# Patient Record
Sex: Male | Born: 1990 | Race: Black or African American | Hispanic: No | Marital: Single | Smoking: Never smoker
Health system: Southern US, Community
[De-identification: ages and names within clinical notes are randomized; demographics above are authoritative.]

---
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
## 2010-01-15 ENCOUNTER — Ambulatory Visit: Payer: Self-pay | Admitting: Internal Medicine

## 2010-01-15 DIAGNOSIS — IMO0002 Reserved for concepts with insufficient information to code with codable children: Secondary | ICD-10-CM

## 2010-03-07 NOTE — Assessment & Plan Note (Signed)
Summary: MENACTRA//Tdap//UA//alp  Nurse Visit   Allergies: No Known Drug Allergies Laboratory Results   Urine Tests  Date/Time Received: September 01, 2009 12:22 PM  Date/Time Reported: September 01, 2009 12:22 PM   Routine Urinalysis   Color: yellow Appearance: Clear Glucose: negative   (Normal Range: Negative) Bilirubin: negative   (Normal Range: Negative) Ketone: negative   (Normal Range: Negative) Spec. Gravity: 1.025   (Normal Range: 1.003-1.035) Blood: negative   (Normal Range: Negative) pH: 6.0   (Normal Range: 5.0-8.0) Protein: 1+   (Normal Range: Negative) Urobilinogen: 0.2   (Normal Range: 0-1) Nitrite: negative   (Normal Range: Negative) Leukocyte Esterace: trace   (Normal Range: Negative)    Comments: Wynona Canes, CMA  September 01, 2009 12:22 PM     Immunizations Administered:  Tetanus Vaccine:    Vaccine Type: Tdap    Site: left deltoid    Mfr: GlaxoSmithKline    Dose: 0.5 ml    Route: IM    Given by: Sid Falcon LPN    Exp. Date: 02/23/2011    Lot #: [id number]  Meningococcal Vaccine:    Vaccine Type: Menactra    Site: right deltoid    Mfr: Sanofi Pasteur    Dose: 0.5 ml    Route: IM    Given by: Sid Falcon LPN    Exp. Date: 02/22/2011    Lot #: [id number]  Orders Added: 1)  UA Dipstick w/o Micro (automated)  [81003] 2)  Tdap => 51yrs IM [90715] 3)  Menactra IM [90734] 4)  Admin 1st Vaccine [90471] 5)  Admin of Any Addtl Vaccine [84132]  Laboratory Results   Urine Tests    Routine Urinalysis   Color: yellow Appearance: Clear Glucose: negative   (Normal Range: Negative) Bilirubin: negative   (Normal Range: Negative) Ketone: negative   (Normal Range: Negative) Spec. Gravity: 1.025   (Normal Range: 1.003-1.035) Blood: negative   (Normal Range: Negative) pH: 6.0   (Normal Range: 5.0-8.0) Protein: 1+   (Normal Range: Negative) Urobilinogen: 0.2   (Normal Range: 0-1) Nitrite: negative   (Normal Range: Negative) Leukocyte Esterace:  trace   (Normal Range: Negative)    Comments: Wynona Canes, CMA  September 01, 2009 12:22 PM

## 2010-03-07 NOTE — Progress Notes (Signed)
Summary: please return call re vaccines  Phone Note Call from Patient Call back at [phone]   Caller: Dad Call For: Evelena Peat MD Summary of Call: Dad would like for you to please give him a call about Immunization that was giving to his son.  Follow-up for Phone Call        Pt has had T-dap and Menactra.  Father informed. Follow-up by: Sid Falcon LPN,  December 25, 2009 5:10 PM  Additional Follow-up for Phone Call Additional follow up Details #1::        Per father, pt has not had Chicken pox, questioning in the Varicella is needed?  Pt is scheduled for flu shot this  Wed, pt home from college.  Additional Follow-up by: Sid Falcon LPN,  December 25, 2009 5:10 PM    Additional Follow-up for Phone Call Additional follow up Details #2::    If we are CERTAIN that he has not had chicken pox he should get Varicella if never received. He should have had Varicella vaccine previously.  If there is ? of prior chicken pox infection, we  would have to check Varicella titer. Follow-up by: Evelena Peat MD,  December 25, 2009 5:26 PM  Additional Follow-up for Phone Call Additional follow up Details #3:: Details for Additional Follow-up Action Taken: OV scheduled, father informed Additional Follow-up by: Sid Falcon LPN,  December 26, 2009 11:02 AM

## 2010-03-07 NOTE — Letter (Signed)
Summary: Douglas Joyce at [facility] at [facility]   Imported By: Maryln Gottron 09/14/2009 13:19:54  _____________________________________________________________________  External Attachment:    Type:   Image     Comment:   External Document

## 2010-03-07 NOTE — Assessment & Plan Note (Signed)
Summary: flu shot, discuss Varicella, Hep A/nn   Vital Signs:  Patient profile:   20 year old male Temp:     98.0 degrees F oral  Vitals Entered By: Sid Falcon LPN (December 27, 2009 12:28 PM)  History of Present Illness: Patient here with immunization questions. Accompanied by father. Father is certain he has not had clinical infection with varicella. No prior vaccination for varicella. Also needs flu vaccine. No egg allergy. No history of hepatitis A. Currently no out of country trips planned. Other immunizations are up to date.  Allergies: No Known Drug Allergies  Past History:  Past Medical History: Last updated: 08/28/2009 no chronic problems.  Physical Exam  General:  Well-developed,well-nourished,in no acute distress; alert,appropriate and cooperative throughout examination Lungs:  Normal respiratory effort, chest expands symmetrically. Lungs are clear to auscultation, no crackles or wheezes. Heart:  Normal rate and regular rhythm. S1 and S2 normal without gallop, murmur, click, rub or other extra sounds.   Impression & Recommendations:  Problem # 1:  Preventive Health Care (ICD-V70.0) discuss immunization issues. Flexing given. We discussed option of checking varicella antibody levels but decided to go ahead with vaccinations and they're certain he has not had this clinically.  Hep A given to return in 6 months for booster  Other Orders: Admin 1st Vaccine (16109) Flu Vaccine 71yrs + (60454) Hepatitis A Vaccine (Adult Dose) (09811) Admin of Any Addtl Vaccine (91478) Varicella  [phone])   Orders Added: 1)  Admin 1st Vaccine [90471] 2)  Flu Vaccine 19yrs + [13086] 3)  Hepatitis A Vaccine (Adult Dose) [90632] 4)  Admin of Any Addtl Vaccine [90472] 5)  Varicella  [90716] 6)  Est. Patient Level III [57846]   Immunizations Administered:  Hepatitis A Vaccine # 1:    Vaccine Type: HepA    Site: left deltoid    Mfr: GlaxoSmithKline    Dose: 1.0 ml    Route:  IM    Given by: Sid Falcon LPN    Exp. Date: 01/04/2012    Lot #: [id number]  Varicella Vaccine # 1:    Vaccine Type: Varicella    Site: left deltoid    Mfr: Merck    Dose: 0.5 ml    Route: [place]    Given by: Sid Falcon LPN    Exp. Date: 04/05/2011    Lot #: [id number]   Immunizations Administered:  Hepatitis A Vaccine # 1:    Vaccine Type: HepA    Site: left deltoid    Mfr: GlaxoSmithKline    Dose: 1.0 ml    Route: IM    Given by: Sid Falcon LPN    Exp. Date: 01/04/2012    Lot #: [id number]  Varicella Vaccine # 1:    Vaccine Type: Varicella    Site: left deltoid    Mfr: Merck    Dose: 0.5 ml    Route: [place]    Given by: Sid Falcon LPN    Exp. Date: 04/05/2011    Lot #: [id number] Flu Vaccine Consent Questions     Do you have a history of severe allergic reactions to this vaccine? no    Any prior history of allergic reactions to egg and/or gelatin? no    Do you have a sensitivity to the preservative Thimersol? no    Do you have a past history of Guillan-Barre Syndrome? no    Do you currently have an acute febrile illness? no    Have you ever had a severe reaction to latex?  no    Vaccine information given and explained to patient? yes    Are you currently pregnant? no    Lot Number:[id number]   Exp Date:08/04/2010   Site Given  Left Deltoid IM .lbflu

## 2010-03-07 NOTE — Progress Notes (Signed)
Summary: Pts mom is req that pt have complete cpx and cpx labs and ua  Phone Note Call from Patient Call back at [phone] [facility] cell or Work #  [phone]   Caller: mom- Carolyn Stare Summary of Call: Pts mom wants pt to get a complete physical with cpx labs and ua done when pt comes in today.   Pls call pts mom on her cell [phone] Initial call taken by: Lucy Antigua,  September 01, 2009 8:20 AM  Follow-up for Phone Call        Pts mom called back and said that she wants to have cpx labs done on her son.  She said that her son already had his cpx done. she just is req to have the cpx labs done and also get any immunizations done that he may need for college.  Follow-up by: Lucy Antigua,  September 01, 2009 11:25 AM  Additional Follow-up for Phone Call Additional follow up Details #1::        Pt here fos shots Additional Follow-up by: Sid Falcon LPN,  September 01, 2009 2:28 PM

## 2010-03-07 NOTE — Assessment & Plan Note (Signed)
Summary: new to est//ccm   Vital Signs:  Patient profile:   20 year old male Height:      69 inches Weight:      168 pounds BMI:     24.90 Temp:     97.5 degrees F oral BP sitting:   102 / 70  (left arm) Cuff size:   regular  Vitals Entered By: Kathrynn Speed CMA (August 28, 2009 11:31 AM) CC: New pt to est, right foot itchin between toes, src Is Patient Diabetic? No  Vision Screening:Left eye w/o correction: 20 / 15 Right Eye w/o correction: 20 / 25 Both eyes w/o correction:  20/ 15        Vision Entered By: Kathrynn Speed CMA (August 28, 2009 12:17 PM)   History of Present Illness: New patient to establish care.  Past medical history reviewed. No chronic illness. No Medications. No prior surgeries. No known drug allergies.  Family history significant for parent with hypertension. Grandparent with stroke.  Otherwise unrevealing.  Social history is that he is single. Will start college at [employer] this fall. Plans to major in architecture. Nonsmoker. No regular alcohol use.  No old records for review at this time. We have no record of prior immunizations. He does not call having any vaccines in the past couple of years.  Preventive Screening-Counseling & Management  Caffeine-Diet-Exercise     Does Patient Exercise: yes  Current Medications (verified): 1)  None  Allergies (verified): No Known Drug Allergies  Past History:  Family History: Last updated: 08/28/2009 Both Parents: HTN Grandparents: Arthritis, HTN, Stroke Brother: Healthy  Social History: Last updated: 08/28/2009 Single Regular exercise-yes  Risk Factors: Exercise: yes (08/28/2009)  Past Medical History: no chronic problems.  Past Surgical History: None  Family History: Both Parents: HTN Grandparents: Arthritis, HTN, Stroke Brother: Healthy  Social History: Single Regular exercise-yes Does Patient Exercise:  yes  Review of Systems  The patient denies  anorexia, fever, weight loss, weight gain, vision loss, decreased hearing, hoarseness, chest pain, syncope, dyspnea on exertion, peripheral edema, prolonged cough, headaches, hemoptysis, abdominal pain, melena, hematochezia, severe indigestion/heartburn, hematuria, incontinence, genital sores, muscle weakness, suspicious skin lesions, transient blindness, difficulty walking, depression, unusual weight change, abnormal bleeding, enlarged lymph nodes, and testicular masses.    Physical Exam  General:  Well-developed,well-nourished,in no acute distress; alert,appropriate and cooperative throughout examination Head:  Normocephalic and atraumatic without obvious abnormalities. No apparent alopecia or balding. Eyes:  No corneal or conjunctival inflammation noted. EOMI. Perrla. Funduscopic exam benign, without hemorrhages, exudates or papilledema. Vision grossly normal. Ears:  External ear exam shows no significant lesions or deformities.  Otoscopic examination reveals clear canals, tympanic membranes are intact bilaterally without bulging, retraction, inflammation or discharge. Hearing is grossly normal bilaterally. Mouth:  Oral mucosa and oropharynx without lesions or exudates.  Teeth in good repair. Neck:  No deformities, masses, or tenderness noted. Lungs:  Normal respiratory effort, chest expands symmetrically. Lungs are clear to auscultation, no crackles or wheezes. Heart:  Normal rate and regular rhythm. S1 and S2 normal without gallop, murmur, click, rub or other extra sounds. Abdomen:  Bowel sounds positive,abdomen soft and non-tender without masses, organomegaly or hernias noted. Genitalia:  Testes bilaterally descended without nodularity, tenderness or masses. No scrotal masses or lesions. No penis lesions or urethral discharge. Msk:  No deformity or scoliosis noted of thoracic or lumbar spine.   Extremities:  No clubbing, cyanosis, edema, or deformity noted with normal full range of motion of all  joints.   Neurologic:  alert & oriented X3, cranial nerves II-XII intact, and strength normal in all extremities.   Skin:  no rashes and no suspicious lesions.   Cervical Nodes:  No lymphadenopathy noted Psych:  normally interactive, good eye contact, not anxious appearing, and not depressed appearing.     Impression & Recommendations:  Problem # 1:  Preventive Health Care (ICD-V70.0) obtain old records regarding immunizations. Patient will likely need Menactra and possibly some others

## 2010-03-07 NOTE — Progress Notes (Signed)
Summary: immunizations & anything else needed?  Phone Note Call from Patient   Caller: Patient Call For: Evelena Peat MD Complaint: Urinary/GYN Problems Summary of Call: patient was calling to see if we received his immunization records and if so if he needs to come in for an office visit? Initial call taken by: Kern Reap CMA Duncan Dull),  August 29, 2009 2:36 PM  Follow-up for Phone Call        Pt informed we did receive the records and are reviewing the vacinnations faxed Follow-up by: Sid Falcon LPN,  August 30, 2009 10:19 AM  Additional Follow-up for Phone Call Additional follow up Details #1::        Dad, Leighton Parody, calling again to see if son needs any immunizations & to check about whether he needs to come back for anything else.  Was the physcial complete?  [phone] Additional Follow-up by: Rudy Jew, RN,  August 30, 2009 1:15 PM    Additional Follow-up for Phone Call Additional follow up Details #2::    Records on Dr Lucie Leather desk Sid Falcon LPN  August 30, 2009 2:09 PM nurse visit only for [facility] and Tdap.  Forms have been signed.  Follow-up by: Evelena Peat MD,  August 30, 2009 5:36 PM  Additional Follow-up for Phone Call Additional follow up Details #3:: Details for Additional Follow-up Action Taken: Father informed, questioned if he needed UA.  Yes, lets do UA per Dr Chad Cordial LPN  August 31, 2009 10:13 AM

## 2010-03-07 NOTE — Letter (Signed)
Summary: Health Evaluation Form/[facility] Evaluation Form/[facility]   Imported By: Maryln Gottron 09/05/2009 12:54:39  _____________________________________________________________________  External Attachment:    Type:   Image     Comment:   External Document

## 2010-03-07 NOTE — Miscellaneous (Signed)
Summary: 1993 - 2011  1993 - 2011   Imported By: Maryln Gottron 01/05/2010 15:14:29  _____________________________________________________________________  External Attachment:    Type:   Image     Comment:   External Document

## 2010-03-08 NOTE — Assessment & Plan Note (Signed)
Summary: ?INFECTION UNDER ARM PITS/NJR   Vital Signs:  Patient profile:   20 year old male Weight:      166 pounds Temp:     98.1 degrees F oral BP sitting:   100 / 62  (left arm) Cuff size:   regular  Vitals Entered By: Duard Brady LPN (January 15, 2010 4:33 PM) CC: c/o knot/?boil inder (L) arm  on ABX from college doctor Is Patient Diabetic? No   CC:  c/o knot/?boil inder (L) arm  on ABX from college doctor.  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  [facility] [place] [facility] a furuncle in the left axillary area.  He is on antibiotic therapy at this time with daily improvement in the size and tenderness of the abscess  involving the left axilla.  His father was concerned about a rash involving the upper back area that predated antibiotic use.  Allergies (verified): No Known Drug Allergies  Physical Exam  General:  Well-developed,well-nourished,in no acute distress; alert,appropriate and cooperative throughout examination Skin:  2 slightly tender inflammatory nodules in the left axilla two to 3 cm in diameter he also had patchy areas of dry, flaky skin involving the upper back area, consistent with some mild tinea versicolor   Impression & Recommendations:  Problem # 1:  ABSCESS, AXILLA (ICD-682.3)  Patient Instructions: 1)  Take your antibiotic as prescribed until ALL of it is gone, but stop if you develop a rash or swelling and contact our office as soon as possible. 2)  warm compresses to the left axilla 3 times daily 3)  Please schedule a follow-up appointment as needed.   Orders Added: 1)  Est. Patient Level III [16109]

## 2010-03-12 ENCOUNTER — Ambulatory Visit (INDEPENDENT_AMBULATORY_CARE_PROVIDER_SITE_OTHER): Payer: BC Managed Care – PPO | Admitting: Family Medicine

## 2010-03-12 ENCOUNTER — Encounter: Payer: Self-pay | Admitting: Family Medicine

## 2010-03-12 VITALS — BP 102/70 | Temp 98.0°F | Ht 69.75 in | Wt 175.0 lb

## 2010-03-12 DIAGNOSIS — L02419 Cutaneous abscess of limb, unspecified: Secondary | ICD-10-CM

## 2010-03-12 DIAGNOSIS — IMO0002 Reserved for concepts with insufficient information to code with codable children: Secondary | ICD-10-CM

## 2010-03-12 MED ORDER — DOXYCYCLINE HYCLATE 100 MG PO TABS: 100.0000 mg | ORAL_TABLET | Freq: Two times a day (BID) | ORAL | Status: AC

## 2010-03-12 NOTE — Patient Instructions (Signed)
Use warm compresses several times daily. Finish out antibiotic Follow up promptly for any fever or worsening rash/lesions.

## 2010-03-12 NOTE — Progress Notes (Signed)
  Subjective:    Patient ID: Douglas Joyce, male    DOB: Mar 13, 1990, 20 y.o.   MRN: [id number]  HPI Patient seen with skin lesion left axillary region. Similar type infection couple months ago treated with antibiotics and resolved. First noticed small pimple-like lesion left lower side couple weeks ago which resolved on its own. Denies any fever or chills. No known history of MRSA. No known drug allergies.   Review of Systems    denies any headache, fever, chills, appetite change, cough Objective:   Physical Exam  healthy-appearing 20 year old male Patient has a small erythematous papule in the left axillary region. Nonfluctuant. Some periodic drainage from the surface. Wound culture obtained Smaller somewhat similar indurated lesion just superior to this No other rashes. Chest clear to auscultation Heart regular rate        Assessment & Plan:  Recurrent small abscess left axilla draining spontaneously. Rule out MRSA Wound culture obtained. Start doxycycline 100 mg twice a day for 10 days

## 2010-03-19 ENCOUNTER — Telehealth: Payer: Self-pay | Admitting: Family Medicine

## 2010-03-19 NOTE — Telephone Encounter (Signed)
Needs results of would culture that was done on last week

## 2010-03-19 NOTE — Telephone Encounter (Signed)
Had long talk with pt's dad.  There is no evidence that [facility] ever received wound cx.  We are unable to determine where sample might have been lost.  I will speak with our lab director tomorrow to see if any further thoughts.  In any event Douglas Joyce is doing better with no fever and wounds are drying up well on Doxycycline.

## 2010-03-23 ENCOUNTER — Telehealth: Payer: Self-pay | Admitting: Family Medicine

## 2010-03-23 NOTE — Telephone Encounter (Signed)
Pts dad called for results of recent lab work.... Can be reached at [phone].

## 2010-03-26 NOTE — Telephone Encounter (Signed)
Message left on [phone] referring back to phone call on 2/13 explaining wound culture had been lost, no lab result available

## 2010-03-26 NOTE — Telephone Encounter (Signed)
As noted.  His wound culture has not been able to be located.

## 2010-06-22 NOTE — Op Note (Signed)
Douglas Joyce, Douglas Joyce                        ACCOUNT NO.:  [id number]   MEDICAL RECORD NO.:  [id number]                   PATIENT TYPE:  AMB   LOCATION:  DSC                                  FACILITY:  [facility]   PHYSICIAN:  Nadara Mustard, M.D.                DATE OF BIRTH:  [birth year]   DATE OF PROCEDURE:  10/20/2003  DATE OF DISCHARGE:                                 OPERATIVE REPORT   PREOPERATIVE DIAGNOSES:  1.  Supernumerary digit, right fifth toe.  2.  Skin tag, left little finger.   POSTOPERATIVE DIAGNOSIS:  1.  Supernumerary digit, right fifth toe.  2.  Skin tag, left little finger.   OPERATION PERFORMED:  1.  Excision of supernumerary digit, right fifth toe.  2.  Excision of left little finger skin tag.   SURGEON:  Nadara Mustard, M.D.   ANESTHESIA:  General.   ESTIMATED BLOOD LOSS:  Minimal.   ANTIBIOTICS:  None.   DRAINS:  None.   COMPLICATIONS:  None.   TOURNIQUET TIME:  None.   DISPOSITION:  To post anesthesia care unit in stable condition.   INDICATIONS FOR PROCEDURE:  The patient is a 20 year old gentleman who is  status post an excision of a sixth digit, left little finger and right  little toe, who has had recurrence of a skin tag on the left little finger  and recurrence of bony growth on the right fifth toe.  The patient has had  pain with activities of daily living and presents at this time for excision  of these two masses.  The risks and benefits were discussed with the patient  and his family including infection, neurovascular injury, persistent pain,  recurrence of the deformity, need for additional surgery.  The patient and  his family state they understand and wish to proceed at this time.   DESCRIPTION OF PROCEDURE:  The patient was brought to the operating room 5  and underwent a general anesthetic.  After adequate level of anesthesia  obtained, the patient's right lower extremity and left upper extremity were  prepped using DuraPrep and  draped into a sterile field.  Attention was first  focused on the right foot.  His previous lateral incision was used.  This  was carried down to the bony growth.  This was excised in one block of  tissue.  Hemostasis was obtained.  The wound was cleansed and the incision  was closed using intracuticular suture with 3-0 Monocryl.  1/4 inch Steri-  Strips were used on the skin.  10 mL of 0.5% Marcaine plain was used for a  digital block postoperatively.  Attention was then focused on the left hand.  The lateral border of the skin tag was excised.  This was excised with a  block of skin.  The wound was irrigated and hemostasis was obtained.  The  incision was closed using intracuticular 4-0 Monocryl.  The  incision was  then closed with 1/4 inch Steri-Strips.  The wound was covered with Adaptic  orthopedic sponges, Webril and a Coban dressing.  This dressing was applied  to both the hand and the foot.  The patient was extubated and taken to PACU  in stable condition.  Plan for discharged to home, weightbearing as  tolerated.  Change the dressing in three days.  Follow up in the office in  two weeks.                                               Nadara Mustard, M.D.    MVD/MEDQ  D:  10/20/2003  T:  10/20/2003  Job:  [id number]

## 2010-07-14 ENCOUNTER — Emergency Department (HOSPITAL_COMMUNITY)
Admission: EM | Admit: 2010-07-14 | Discharge: 2010-07-14 | Disposition: A | Discharge status: Home / Self Care | Payer: PRIVATE HEALTH INSURANCE | Attending: Emergency Medicine | Admitting: Emergency Medicine

## 2010-07-14 DIAGNOSIS — R21 Rash and other nonspecific skin eruption: Secondary | ICD-10-CM | POA: Insufficient documentation

## 2010-07-14 DIAGNOSIS — L299 Pruritus, unspecified: Secondary | ICD-10-CM | POA: Insufficient documentation

## 2011-03-18 ENCOUNTER — Encounter: Payer: Self-pay | Admitting: Family Medicine

## 2011-03-18 ENCOUNTER — Ambulatory Visit (INDEPENDENT_AMBULATORY_CARE_PROVIDER_SITE_OTHER): Payer: BC Managed Care – PPO | Admitting: Family Medicine

## 2011-03-18 VITALS — BP 110/78 | Temp 97.7°F | Wt 182.0 lb

## 2011-03-18 DIAGNOSIS — R748 Abnormal levels of other serum enzymes: Secondary | ICD-10-CM

## 2011-03-18 LAB — HEPATIC FUNCTION PANEL
ALT: 65 U/L — ABNORMAL HIGH (ref 0–53)
Albumin: 4 g/dL (ref 3.5–5.2)
Alkaline Phosphatase: 54 U/L (ref 39–117)
Bilirubin, Direct: 0 mg/dL (ref 0.0–0.3)
Total Protein: 7.2 g/dL (ref 6.0–8.3)

## 2011-03-18 LAB — CBC WITH DIFFERENTIAL/PLATELET
Basophils Absolute: 0 10*3/uL (ref 0.0–0.1)
Eosinophils Absolute: 0.3 10*3/uL (ref 0.0–0.7)
Hemoglobin: 13.4 g/dL (ref 13.0–17.0)
Lymphocytes Relative: 36.4 % (ref 12.0–46.0)
MCHC: 34.5 g/dL (ref 30.0–36.0)
Neutro Abs: 1.5 10*3/uL (ref 1.4–7.7)
Neutrophils Relative %: 44.2 % (ref 43.0–77.0)
Platelets: 320 10*3/uL (ref 150.0–400.0)
RDW: 13.9 % (ref 11.5–14.6)

## 2011-03-18 LAB — POCT URINALYSIS DIPSTICK
Blood, UA: NEGATIVE
Ketones, UA: NEGATIVE
Protein, UA: NEGATIVE
Spec Grav, UA: <=1.005
pH, UA: 7.5

## 2011-03-18 NOTE — Progress Notes (Signed)
  Subjective:    Patient ID: Douglas Joyce, male    DOB: March 23, 1990, 21 y.o.   MRN: [id number]  HPI  Patient seen for recent spike and creatinine kinase. 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 Elevated creatinine kinase of 44,000 with elevated ALT and AST. 1 type 2 diabetes. We elected not to check A1c today it has been off all medications. Refills for medications given and recheck A1c in 2-3 months #2 hyperlipidemia. Get back on simvastatin recheck lipids of polyp #3 history of urethral stricture status post recent surgery improved #4 hypertension. Not at goal but currently not on medication.  Subsequently had repeat lab work 3 days later on the seventh with creatinine kinase 10,700 with AST of 224 and ALT of 145. He's been basically asymptomatic. No history of similar issue. No recent myalgias. No arthralgias. No peripheral edema. No decreased urination. Denies any recent prescription or over-the-counter medications. Has taken creatine in the past but not for several months. Good appetite. No recent weight changes.  Patient reportedly had urine dipstick which showed some blood and protein. We do not have those records. No known family history of metabolic syndrome   Review of Systems  Constitutional: Negative for fever, chills, activity change, appetite change, fatigue and unexpected weight change.  Respiratory: Negative for cough and shortness of breath.   Cardiovascular: Negative for chest pain, palpitations and leg swelling.  Gastrointestinal: Negative for abdominal pain.  Genitourinary: Negative for dysuria, hematuria and decreased urine volume.  Musculoskeletal: Negative for myalgias and arthralgias.  Skin: Negative for rash.  Neurological: Negative for dizziness, syncope, weakness and headaches.       Objective:   Physical Exam  Constitutional: He is oriented to person, place, and time. He appears well-developed and well-nourished.  HENT:  Mouth/Throat: Oropharynx is clear  and moist.  Neck: Neck supple. No thyromegaly present.  Cardiovascular: Normal rate and regular rhythm.   Pulmonary/Chest: Effort normal and breath sounds normal. No respiratory distress. He has no wheezes. He has no rales.  Musculoskeletal: He exhibits no edema.  Lymphadenopathy:    He has no cervical adenopathy.  Neurological: He is alert and oriented to person, place, and time.  Skin: No rash noted.          Assessment & Plan:  Elevated creatinine kinase with serum transaminase elevation. Uncertain etiology. ?hereditary/metabolic, ?autoimmune, ?thyroid disease. No history of prescription or supplement drug use. Start with repeat urinalysis. Repeat creatinine kinase. Consider nephrology referral Check additional labs with TSH, ESR, ANA

## 2011-03-20 ENCOUNTER — Telehealth: Payer: Self-pay | Admitting: Family Medicine

## 2011-03-20 NOTE — Telephone Encounter (Signed)
Please advise 

## 2011-03-20 NOTE — Telephone Encounter (Signed)
Labs okay except for minimally low (and insignificant) white blood count.  Creatinine kinase is normalizing. He did not have evidence for proteinuria on urine dipstick. My suggestion since this is returning to normal is that he have a least one more repeat creatinine kinase after exercise session to see if this is increasing post exercise

## 2011-03-20 NOTE — Telephone Encounter (Signed)
Pt mother is requesting bloodwork results also a copy fax to her  At work  [phone].

## 2011-03-20 NOTE — Telephone Encounter (Signed)
Mother given information, she has a personal fax and is in her office now to receive it.  Labs faxed, confirmation received.  Son back at school and she will arrange to have repeat post exercise done at school lab

## 2011-03-20 NOTE — Telephone Encounter (Signed)
Pts mom called back and wants to know status of getting her sons lab results, faxed to her work. Pts mom said that she signed paperwork when pt was in for ov on Monday, that would allow her to rcv this. Pls put cover sheet on fax and put attn Gershon Cull and fax to fax # [phone]. Pts mom needs this today.

## 2011-03-21 NOTE — Progress Notes (Signed)
Quick Note:  Mother informed. Labs were faxed to mother as requested on phone note ______

## 2011-10-25 IMAGING — CR DG CLAVICLE*L*
2 series · 2 of 2 positions shown · non-contrast
Comparison: None.

CLINICAL DATA: 18-year-old male status post fall with left shoulder
pain.

LEFT CLAVICLE - 2+ VIEWS

[w clavicle ap left]
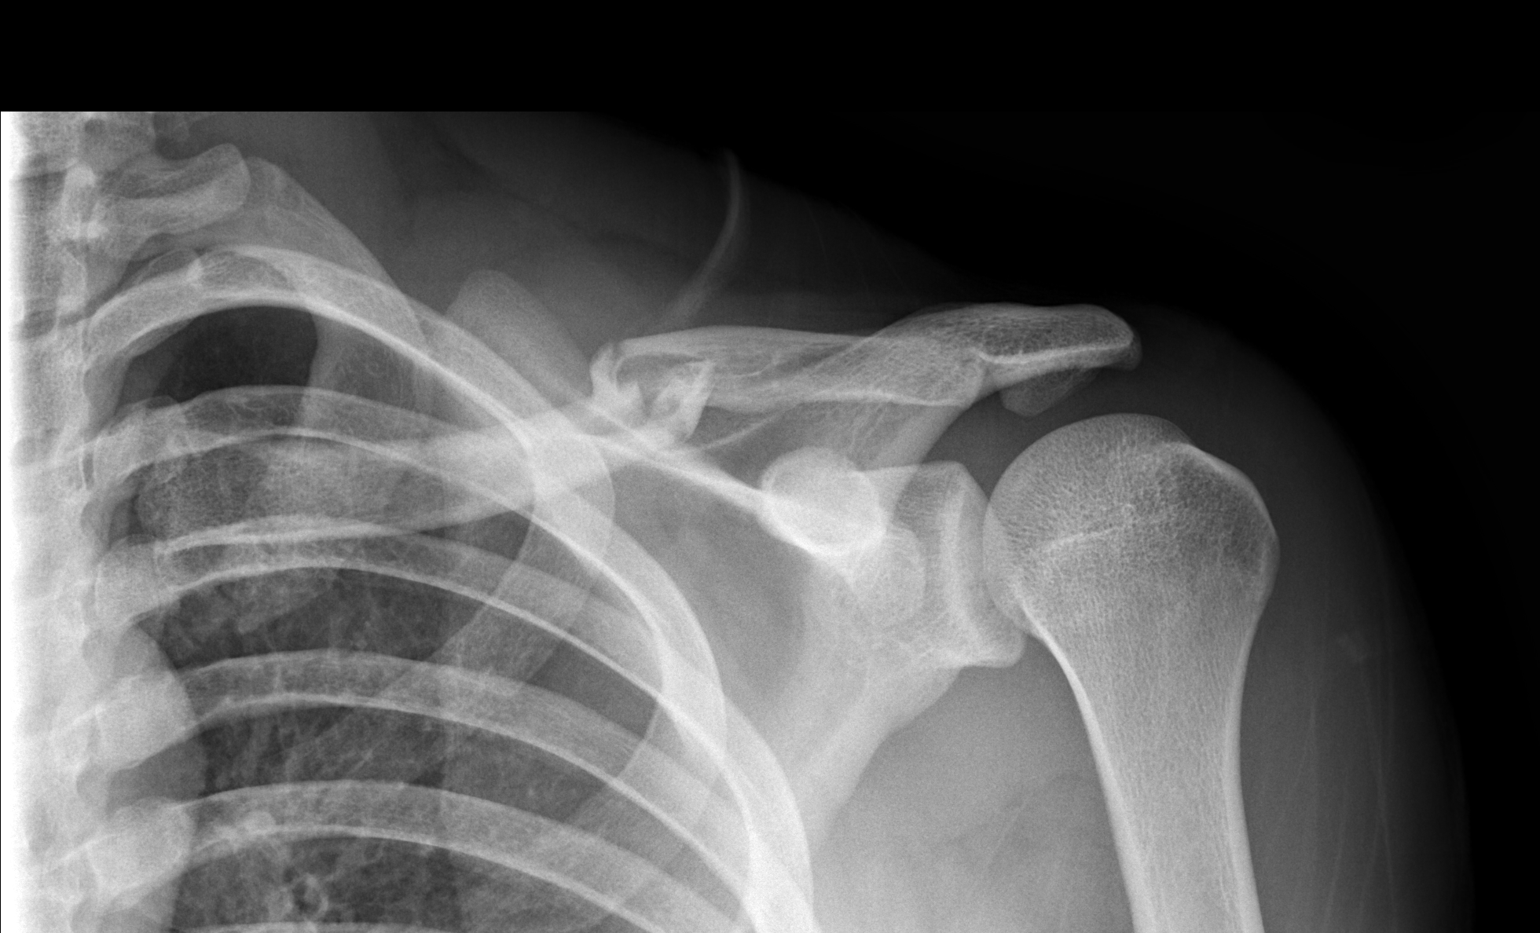

[w clavicle tangential left *]
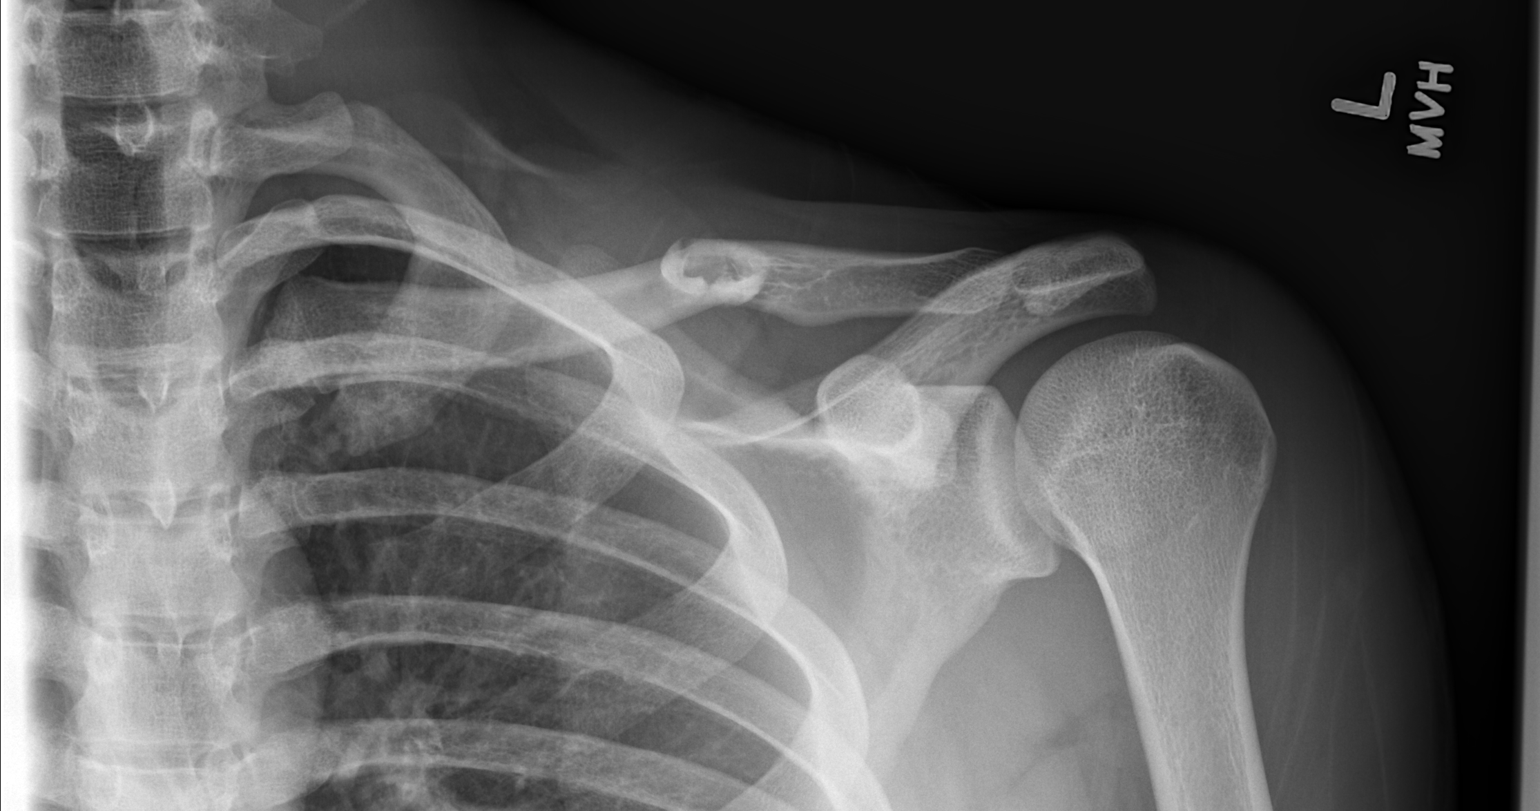

[2 of 2 positions shown; findings below may reference images not displayed]

FINDINGS: There is a mildly comminuted fracture of the mid left
clavicular shaft.  The distal fragment is displaced by nearly one
full shaft width and inferiorly angulated.  This might be an
incompleted fracture. Acromioclavicular and coracoclavicular
intervals appear within normal limits.

Other visualized osseous structures of the left shoulder are within
normal limits.  Visualized left ribs and lung parenchyma are within
normal limits.
IMPRESSION: Mid shaft left clavicle fracture with superior displacement and
inferior angulation.

## 2013-01-22 ENCOUNTER — Other Ambulatory Visit: Payer: PRIVATE HEALTH INSURANCE

## 2013-01-27 ENCOUNTER — Encounter: Payer: PRIVATE HEALTH INSURANCE | Admitting: Family Medicine

## 2013-01-27 DIAGNOSIS — Z0289 Encounter for other administrative examinations: Secondary | ICD-10-CM

## 2013-03-11 ENCOUNTER — Ambulatory Visit: Payer: PRIVATE HEALTH INSURANCE | Admitting: Family Medicine

## 2013-03-11 DIAGNOSIS — Z0289 Encounter for other administrative examinations: Secondary | ICD-10-CM

## 2013-07-16 ENCOUNTER — Encounter: Payer: Self-pay | Admitting: Family Medicine

## 2013-07-16 ENCOUNTER — Ambulatory Visit (INDEPENDENT_AMBULATORY_CARE_PROVIDER_SITE_OTHER): Payer: PRIVATE HEALTH INSURANCE | Admitting: Family Medicine

## 2013-07-16 VITALS — BP 120/74 | HR 91 | Temp 98.4°F | Wt 179.0 lb

## 2013-07-16 DIAGNOSIS — R740 Nonspecific elevation of levels of transaminase and lactic acid dehydrogenase [LDH]: Secondary | ICD-10-CM

## 2013-07-16 DIAGNOSIS — R7402 Elevation of levels of lactic acid dehydrogenase (LDH): Secondary | ICD-10-CM

## 2013-07-16 DIAGNOSIS — R7401 Elevation of levels of liver transaminase levels: Secondary | ICD-10-CM

## 2013-07-16 DIAGNOSIS — R748 Abnormal levels of other serum enzymes: Secondary | ICD-10-CM

## 2013-07-16 NOTE — Progress Notes (Signed)
   Subjective:    Patient ID: Douglas Joyce, male    DOB: 21-Sep-1990, 23 y.o.   MRN: [id number]  HPI Patient is seen requesting followup lab work. Over a couple years ago he was at college and had some lab work done with elevated creatinine kinase over 44,000 with modestly elevated liver transaminases. Subsequent lab work showed resolution of these. He was working out heavily at the time but has not had any consistent pattern of myalgias after workout. He generally feels very well this time. No appetite or weight changes. We considered several possible etiologies. He did not have any obvious infectious cause. No evidence for autoimmune disease. No family history of metabolic disorder. We obtained several labs including TSH, ANA, sed rate and these were normal. No prior history of transfusion. No history of IV drug abuse.  He occasionally consumes alcohol but not regularly. Denies illicit drug use. Takes no prescription medications. Good exercise tolerance. Appetite and weight have been stable.  No past medical history on file. No past surgical history on file.  reports that he has never smoked. He does not have any smokeless tobacco history on file. He reports that he does not use illicit drugs. His alcohol history is not on file. family history is not on file. No Known Allergies      Review of Systems  Constitutional: Negative for fever, chills, appetite change and unexpected weight change.  Respiratory: Negative for shortness of breath.   Gastrointestinal: Negative for nausea, vomiting and abdominal distention.  Musculoskeletal: Negative for arthralgias.  Skin: Negative for rash.       Objective:   Physical Exam  Constitutional: He appears well-developed and well-nourished.  Neck: Neck supple. No thyromegaly present.  Cardiovascular: Normal rate and regular rhythm.   Pulmonary/Chest: Effort normal and breath sounds normal. No respiratory distress. He has no wheezes. He has no  rales.  Abdominal: Soft. Bowel sounds are normal. He exhibits no distension and no mass. There is no tenderness. There is no rebound and no guarding.          Assessment & Plan:  History of elevated creatinine kinase and liver transaminases remotely couple years ago. Patient requesting followup labs. His not having any concerning symptoms and nonfocal exam. Repeat liver transaminases and creatinine kinase.

## 2013-07-16 NOTE — Progress Notes (Signed)
Pre visit review using our clinic review tool, if applicable. No additional management support is needed unless otherwise documented below in the visit note. 

## 2013-07-17 LAB — HEPATIC FUNCTION PANEL
ALK PHOS: 58 U/L (ref 39–117)
ALT: 45 U/L (ref 0–53)
AST: 27 U/L (ref 0–37)
Albumin: 4.7 g/dL (ref 3.5–5.2)
BILIRUBIN DIRECT: 0.1 mg/dL (ref 0.0–0.3)
BILIRUBIN INDIRECT: 0.3 mg/dL (ref 0.2–1.2)
TOTAL PROTEIN: 7.2 g/dL (ref 6.0–8.3)
Total Bilirubin: 0.4 mg/dL (ref 0.2–1.2)

## 2013-07-17 LAB — CK: CK TOTAL: 411 U/L — AB (ref 7–232)

## 2013-08-04 ENCOUNTER — Telehealth: Payer: Self-pay | Admitting: Family Medicine

## 2013-08-04 NOTE — Telephone Encounter (Signed)
Mailed information to home address.

## 2013-08-04 NOTE — Telephone Encounter (Signed)
Pt would like a copy of labs and results from office visit on 6/12 w/ any comments from the doctor mailed to the home.

## 2014-03-28 ENCOUNTER — Encounter: Payer: Self-pay | Admitting: Family Medicine

## 2014-03-28 ENCOUNTER — Ambulatory Visit (INDEPENDENT_AMBULATORY_CARE_PROVIDER_SITE_OTHER): Payer: PRIVATE HEALTH INSURANCE | Admitting: Family Medicine

## 2014-03-28 VITALS — BP 128/90 | HR 61 | Temp 98.1°F | Ht 69.0 in | Wt 193.0 lb

## 2014-03-28 DIAGNOSIS — Z0000 Encounter for general adult medical examination without abnormal findings: Secondary | ICD-10-CM | POA: Diagnosis not present

## 2014-03-28 LAB — BASIC METABOLIC PANEL
BUN: 12 mg/dL (ref 6–23)
CO2: 27 meq/L (ref 19–32)
Calcium: 9.7 mg/dL (ref 8.4–10.5)
Chloride: 103 mEq/L (ref 96–112)
Creatinine, Ser: 0.93 mg/dL (ref 0.40–1.50)
GFR: 129.1 mL/min (ref >60.00)
Glucose, Bld: 90 mg/dL (ref 70–99)
POTASSIUM: 4.2 meq/L (ref 3.5–5.1)
Sodium: 139 mEq/L (ref 135–145)

## 2014-03-28 LAB — HEPATIC FUNCTION PANEL
ALK PHOS: 64 U/L (ref 39–117)
ALT: 47 U/L (ref 0–53)
AST: 30 U/L (ref 0–37)
Albumin: 4.5 g/dL (ref 3.5–5.2)
BILIRUBIN DIRECT: 0.1 mg/dL (ref 0.0–0.3)
BILIRUBIN TOTAL: 0.7 mg/dL (ref 0.2–1.2)
Total Protein: 7.5 g/dL (ref 6.0–8.3)

## 2014-03-28 LAB — CBC WITH DIFFERENTIAL/PLATELET
BASOS ABS: 0 10*3/uL (ref 0.0–0.1)
Basophils Relative: 0.9 % (ref 0.0–3.0)
EOS ABS: 0.2 10*3/uL (ref 0.0–0.7)
EOS PCT: 4.9 % (ref 0.0–5.0)
HCT: 44.9 % (ref 39.0–52.0)
HEMOGLOBIN: 15.3 g/dL (ref 13.0–17.0)
LYMPHS ABS: 1.4 10*3/uL (ref 0.7–4.0)
LYMPHS PCT: 33.6 % (ref 12.0–46.0)
MCHC: 34.1 g/dL (ref 30.0–36.0)
MCV: 94.4 fl (ref 78.0–100.0)
MONO ABS: 0.5 10*3/uL (ref 0.1–1.0)
Monocytes Relative: 11.3 % (ref 3.0–12.0)
NEUTROS ABS: 2.1 10*3/uL (ref 1.4–7.7)
NEUTROS PCT: 49.3 % (ref 43.0–77.0)
PLATELETS: 356 10*3/uL (ref 150.0–400.0)
RBC: 4.76 Mil/uL (ref 4.22–5.81)
RDW: 13.3 % (ref 11.5–15.5)
WBC: 4.3 10*3/uL (ref 4.0–10.5)

## 2014-03-28 LAB — LIPID PANEL
CHOL/HDL RATIO: 4
Cholesterol: 177 mg/dL (ref 0–200)
HDL: 48.6 mg/dL (ref >39.00)
LDL Cholesterol: 90 mg/dL (ref 0–99)
NonHDL: 128.4
Triglycerides: 194 mg/dL — ABNORMAL HIGH (ref 0.0–149.0)
VLDL: 38.8 mg/dL (ref 0.0–40.0)

## 2014-03-28 LAB — CK: Total CK: 234 U/L — ABNORMAL HIGH (ref 7–232)

## 2014-03-28 LAB — TSH: TSH: 1.62 u[IU]/mL (ref 0.35–4.50)

## 2014-03-28 NOTE — Progress Notes (Signed)
Pre visit review using our clinic review tool, if applicable. No additional management support is needed unless otherwise documented below in the visit note. 

## 2014-03-28 NOTE — Progress Notes (Signed)
   Subjective:    Patient ID: Douglas Joyce, male    DOB: 1990/04/26, 24 y.o.   MRN: [id number]  HPI Seen for complete physical. He is finishing up college at [employer]. He plans to enter the [employer] sometime next year. He is an avid Licensed conveyancerweightlifter. He has had some occasional low back pains for past 2 months after doing some squats and feeling a popping sensation is lower back. His pain has improved overall since then. Is doing some stretches intermittently. No radiculopathy symptoms. No lower extremity numbness or weakness. Pain exacerbated with back flexion.   He has a new girlfriend. He has used barrier protection in the past. No history of STDs. He is requesting STD screening. No rashes or other suspicious symptoms. Currently takes no regular medications. Tetanus up-to-date. Never smoked. No illicit drug use.  No past medical history on file. No past surgical history on file.  reports that he has never smoked. He does not have any smokeless tobacco history on file. He reports that he does not use illicit drugs. His alcohol history is not on file. family history is not on file. No Known Allergies    Review of Systems  Constitutional: Negative for fever, activity change, appetite change and fatigue.  HENT: Negative for congestion, ear pain and trouble swallowing.   Eyes: Negative for pain and visual disturbance.  Respiratory: Negative for cough, shortness of breath and wheezing.   Cardiovascular: Negative for chest pain and palpitations.  Gastrointestinal: Negative for nausea, vomiting, abdominal pain, diarrhea, constipation, blood in stool, abdominal distention and rectal pain.  Endocrine: Negative for polydipsia and polyuria.  Genitourinary: Negative for dysuria, hematuria and testicular pain.  Musculoskeletal: Positive for back pain. Negative for joint swelling and arthralgias.  Skin: Negative for rash.  Neurological: Negative for dizziness, syncope and  headaches.  Hematological: Negative for adenopathy.  Psychiatric/Behavioral: Negative for confusion and dysphoric mood.       Objective:   Physical Exam  Constitutional: He is oriented to person, place, and time. He appears well-developed and well-nourished. No distress.  HENT:  Head: Normocephalic and atraumatic.  Right Ear: External ear normal.  Left Ear: External ear normal.  Mouth/Throat: Oropharynx is clear and moist.  Eyes: Conjunctivae and EOM are normal. Pupils are equal, round, and reactive to light.  Neck: Normal range of motion. Neck supple. No thyromegaly present.  Cardiovascular: Normal rate, regular rhythm and normal heart sounds.   No murmur heard. Pulmonary/Chest: No respiratory distress. He has no wheezes. He has no rales.  Abdominal: Soft. Bowel sounds are normal. He exhibits no distension and no mass. There is no tenderness. There is no rebound and no guarding.  Musculoskeletal: He exhibits no edema.  Lymphadenopathy:    He has no cervical adenopathy.  Neurological: He is alert and oriented to person, place, and time. He displays normal reflexes. No cranial nerve deficit.  Skin: No rash noted.  Psychiatric: He has a normal mood and affect.          Assessment & Plan:  Complete physical. Obtain screening lab work per his request and include STD screens. We will also check creatinine kinase as he had elevation and spike in creatinine kinase over year ago following acute illness.  He denies any myalgias but is requesting to confirm this is back to baseline.  He does not have any history of easy muscle fatigability or myalgias following exercise and no known metabolic derangements.

## 2014-03-29 ENCOUNTER — Other Ambulatory Visit: Payer: Self-pay | Admitting: *Deleted

## 2014-03-29 LAB — GC/CHLAMYDIA PROBE AMP, URINE
Chlamydia, Swab/Urine, PCR: POSITIVE — AB
GC Probe Amp, Urine: NEGATIVE

## 2014-03-29 LAB — HIV ANTIBODY (ROUTINE TESTING W REFLEX): HIV 1&2 Ab, 4th Generation: NONREACTIVE

## 2014-03-29 LAB — RPR

## 2014-03-29 MED ORDER — AZITHROMYCIN 500 MG PO TABS: 1000.0000 mg | ORAL_TABLET | Freq: Every day | ORAL | Status: DC

## 2014-03-30 ENCOUNTER — Telehealth: Payer: Self-pay | Admitting: Family Medicine

## 2014-03-30 NOTE — Telephone Encounter (Signed)
Pt would like dr burchette to give him a call.  Pt refused to elaborate it is personal

## 2014-03-31 ENCOUNTER — Other Ambulatory Visit: Payer: Self-pay | Admitting: Family Medicine

## 2014-03-31 DIAGNOSIS — Z202 Contact with and (suspected) exposure to infections with a predominantly sexual mode of transmission: Secondary | ICD-10-CM

## 2014-03-31 NOTE — Telephone Encounter (Signed)
Pt had chlamydia.  Treated with Zithromax 1 gm.  We stated this should be eradicated with one treatment.  Pt is requesting repeat urine for chlamydia in one week to be sure.  OK to order future order for one week.

## 2014-03-31 NOTE — Telephone Encounter (Signed)
Lab is ordered.  

## 2014-04-01 ENCOUNTER — Telehealth: Payer: Self-pay | Admitting: Family Medicine

## 2014-04-01 NOTE — Telephone Encounter (Signed)
Left message for patient to return call.

## 2014-04-01 NOTE — Telephone Encounter (Signed)
mom asked if someone could call her about pt.  She states she has some questions about what the doc told him.  advised mom she is not on DPR and no one could speak w/ her  Concerning pt. Then she states she has some "general" questions. I told her I did not think so, but she insisted I ask someone to call her.

## 2014-04-15 ENCOUNTER — Encounter: Payer: Self-pay | Admitting: Family Medicine

## 2014-04-15 ENCOUNTER — Ambulatory Visit (INDEPENDENT_AMBULATORY_CARE_PROVIDER_SITE_OTHER): Payer: PRIVATE HEALTH INSURANCE | Admitting: Family Medicine

## 2014-04-15 VITALS — BP 120/80 | HR 70 | Temp 98.2°F | Wt 198.0 lb

## 2014-04-15 DIAGNOSIS — Z8619 Personal history of other infectious and parasitic diseases: Secondary | ICD-10-CM | POA: Diagnosis not present

## 2014-04-15 NOTE — Progress Notes (Signed)
   Subjective:    Patient ID: Douglas Joyce, male    DOB: 1990-05-08, 24 y.o.   MRN: [id number]  HPI Patient recently diagnosed with chlamydia. He is here basically requesting test of cure. He was treated with azithromycin. He never had any symptoms. He had recent new partner. She has been notified and also treated. His other tests including HIV, RPR, and gonorrhea were negative. He's had no prior history of STD. No dysuria. No penile discharge. No fevers or chills.  No past medical history on file. No past surgical history on file.  reports that he has never smoked. He does not have any smokeless tobacco history on file. He reports that he does not use illicit drugs. His alcohol history is not on file. family history is not on file. No Known Allergies    Review of Systems  Constitutional: Negative for fever and chills.  Genitourinary: Negative for dysuria and discharge.       Objective:   Physical Exam  Constitutional: He appears well-developed and well-nourished.  Cardiovascular: Normal rate and regular rhythm.   Pulmonary/Chest: Effort normal and breath sounds normal. No respiratory distress. He has no wheezes. He has no rales.          Assessment & Plan:  Recent chlamydia. Patient requesting test of cure. He took azithromycin we explained this should've taken care of his infection. We'll check urine probe for GC and Chlamydia. We discussed barrier protection at all times

## 2014-04-15 NOTE — Patient Instructions (Signed)
Chlamydia Chlamydia is an infection. It is spread through sexual contact. Chlamydia can be in different areas of the body. These areas include the urethra, throat, or rectum. It is important to treat chlamydia as soon as possible. It can damage other organs.  CAUSES  Chlamydia is caused by bacteria. It is a sexually transmitted disease. This means that it is passed from an infected partner during intimate contact. This contact could be with the genitals, mouth, or rectal area.  SIGNS AND SYMPTOMS  There may not be any symptoms. This is often the case early in the infection. If there are symptoms, they are usually mild and may only be noticeable in the morning. Symptoms you may notice include:   Burning with urination.  Pain or swelling in the testicles.  Watery mucus-like discharge from the penis.  Long-standing (chronic) pelvic pain after frequent infections.  Pain, swelling, or itching around the anus.  A sore throat.  Itching, burning, or redness in the eyes, or discharge from the eyes. DIAGNOSIS  To diagnose this infection, your health care provider will do a pelvic exam. A sample of urine or a swab from the rectum may be taken for testing.  TREATMENT  Chlamydia is treated with antibiotic medicines.  HOME CARE INSTRUCTIONS  Take your antibiotic medicine as directed by your health care provider. Finish the antibiotic even if you start to feel better. Incomplete treatment will put you at risk for not being able to have children (sterility).   Take medicines only as directed by your health care provider.   Rest.   Inform any sexual partners about your infection. Even if they are symptom free or have a negative culture or evaluation, they should be treated for the condition.   Do not have sex (intercourse) until treatment is completed and your health care provider says it is okay.   Keep all follow-up visits as directed by your health care provider.   Not all test results  are available during your visit. If your test results are not back during the visit, make an appointment with your health care provider to find out the results. Do not assume everything is normal if you have not heard from your health care provider or the medical facility. It is your responsibility to get your test results. SEEK MEDICAL CARE IF:  You develop new joint pain.  You have a fever. SEEK IMMEDIATE MEDICAL CARE IF:   Your pain increases.   You have abnormal discharge.   You have pain during intercourse. MAKE SURE YOU:   Understand these instructions.  Will watch your condition.  Will get help right away if you are not doing well or get worse. Document Released: 01/21/2005 Document Revised: 06/07/2013 Document Reviewed: 07/30/2012 Exit[employer] Patient Information 2015 [employer]. This information is not intended to replace advice given to you by your health care provider. Make sure you discuss any questions you have with your health care provider.  

## 2014-04-15 NOTE — Progress Notes (Signed)
Pre visit review using our clinic review tool, if applicable. No additional management support is needed unless otherwise documented below in the visit note. 

## 2014-04-16 LAB — GC/CHLAMYDIA PROBE AMP, URINE
Chlamydia, Swab/Urine, PCR: NEGATIVE
GC Probe Amp, Urine: NEGATIVE

## 2014-05-18 ENCOUNTER — Ambulatory Visit: Payer: PRIVATE HEALTH INSURANCE | Admitting: Family Medicine

## 2014-05-19 ENCOUNTER — Ambulatory Visit (INDEPENDENT_AMBULATORY_CARE_PROVIDER_SITE_OTHER): Payer: PRIVATE HEALTH INSURANCE | Admitting: Family Medicine

## 2014-05-19 ENCOUNTER — Ambulatory Visit: Payer: PRIVATE HEALTH INSURANCE | Admitting: Family Medicine

## 2014-05-19 ENCOUNTER — Encounter: Payer: Self-pay | Admitting: Family Medicine

## 2014-05-19 VITALS — BP 126/90 | HR 79 | Temp 97.9°F | Wt 199.0 lb

## 2014-05-19 DIAGNOSIS — Z113 Encounter for screening for infections with a predominantly sexual mode of transmission: Secondary | ICD-10-CM

## 2014-05-19 DIAGNOSIS — S29011A Strain of muscle and tendon of front wall of thorax, initial encounter: Secondary | ICD-10-CM

## 2014-05-19 DIAGNOSIS — L259 Unspecified contact dermatitis, unspecified cause: Secondary | ICD-10-CM | POA: Diagnosis not present

## 2014-05-19 MED ORDER — TRIAMCINOLONE ACETONIDE 0.1 % EX CREA: 1.0000 "application " | TOPICAL_CREAM | Freq: Two times a day (BID) | CUTANEOUS | Status: DC | PRN

## 2014-05-19 NOTE — Progress Notes (Signed)
Pre visit review using our clinic review tool, if applicable. No additional management support is needed unless otherwise documented below in the visit note. 
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
## 2014-05-20 ENCOUNTER — Telehealth: Payer: Self-pay | Admitting: Family Medicine

## 2014-05-20 LAB — RPR

## 2014-05-20 LAB — GC/CHLAMYDIA PROBE AMP, URINE
CHLAMYDIA, SWAB/URINE, PCR: NEGATIVE
GC Probe Amp, Urine: NEGATIVE

## 2014-05-20 LAB — HIV ANTIBODY (ROUTINE TESTING W REFLEX): HIV 1&2 Ab, 4th Generation: NONREACTIVE

## 2014-05-20 NOTE — Telephone Encounter (Signed)
Pt called to say that he has a question about the labs that were place. He was  very persistent in talking to Dr Caryl NeverBurchette

## 2014-05-20 NOTE — Telephone Encounter (Signed)
Spoke with patient.

## 2014-05-20 NOTE — Telephone Encounter (Signed)
Left message for patient to return call.

## 2014-05-25 ENCOUNTER — Encounter: Payer: Self-pay | Admitting: Family Medicine

## 2014-05-25 ENCOUNTER — Ambulatory Visit (INDEPENDENT_AMBULATORY_CARE_PROVIDER_SITE_OTHER): Payer: PRIVATE HEALTH INSURANCE | Admitting: Family Medicine

## 2014-05-25 VITALS — BP 110/80 | Temp 98.3°F | Wt 198.0 lb

## 2014-05-25 DIAGNOSIS — L299 Pruritus, unspecified: Secondary | ICD-10-CM

## 2014-05-25 NOTE — Patient Instructions (Signed)
Follow up for any new rash or concern

## 2014-05-25 NOTE — Progress Notes (Signed)
Pre visit review using our clinic review tool, if applicable. No additional management support is needed unless otherwise documented below in the visit note. 

## 2014-05-25 NOTE — Progress Notes (Signed)
   Subjective:    Patient ID: Douglas Joyce, male    DOB: 1990-11-09, 24 y.o.   MRN: [id number]  HPI  Patient seen with some mild itching intermittently involving the shaft of the penis. He had recent chlamydia which was treated. He was seen here just last week and we retested as he had a new sexual exposure and was tested for chlamydia, GC, RPR, HIV and all were negative. No penile discharge. He has not seen any visible rash. No adenopathy. No fevers or chills. He relies of that he has some anxiety issues since his recent chlamydia. He has never had HPV or any other STD.  No past medical history on file. No past surgical history on file.  reports that he has never smoked. He does not have any smokeless tobacco history on file. He reports that he does not use illicit drugs. His alcohol history is not on file. family history is not on file. No Known Allergies   Review of Systems  Constitutional: Negative for fever and chills.  Genitourinary: Negative for dysuria and discharge.  Skin: Negative for rash.       Objective:   Physical Exam  Constitutional: He appears well-developed and well-nourished.  Cardiovascular: Normal rate and regular rhythm.   Genitourinary:  Circumcised male. No visible rash whatsoever. No penile discharge          Assessment & Plan:  Pruritus involving his genital area. No visible rash. NO evidence for candida, HPV, molluscum, or herpes. Reassurance. Follow-up for any skin rash or other new symptoms

## 2014-06-28 ENCOUNTER — Ambulatory Visit: Payer: PRIVATE HEALTH INSURANCE | Admitting: Family Medicine

## 2014-07-01 ENCOUNTER — Ambulatory Visit: Payer: PRIVATE HEALTH INSURANCE | Admitting: Family Medicine

## 2014-07-01 DIAGNOSIS — Z0289 Encounter for other administrative examinations: Secondary | ICD-10-CM

## 2014-07-13 ENCOUNTER — Ambulatory Visit (INDEPENDENT_AMBULATORY_CARE_PROVIDER_SITE_OTHER): Payer: PRIVATE HEALTH INSURANCE | Admitting: Family Medicine

## 2014-07-13 ENCOUNTER — Encounter: Payer: Self-pay | Admitting: Family Medicine

## 2014-07-13 ENCOUNTER — Ambulatory Visit (INDEPENDENT_AMBULATORY_CARE_PROVIDER_SITE_OTHER)
Admission: RE | Admit: 2014-07-13 | Discharge: 2014-07-13 | Disposition: A | Discharge status: Home / Self Care | Payer: PRIVATE HEALTH INSURANCE | Source: Ambulatory Visit | Attending: Family Medicine | Admitting: Family Medicine

## 2014-07-13 VITALS — BP 128/80 | HR 70 | Temp 98.3°F | Wt 193.0 lb

## 2014-07-13 DIAGNOSIS — M545 Low back pain, unspecified: Secondary | ICD-10-CM

## 2014-07-13 DIAGNOSIS — M4306 Spondylolysis, lumbar region: Secondary | ICD-10-CM | POA: Diagnosis not present

## 2014-07-13 NOTE — Progress Notes (Addendum)
   Subjective:    Patient ID: Douglas Joyce, male    DOB: Jun 18, 1990, 24 y.o.   MRN: [id number]  HPI Patient seen with 3-4 month history of low back pain midline without radiation. Started after squatting with about 400 pounds and when he was coming up he noticed somewhat of a popping sensation and had some pain since then. Has never had any radiation of pain. Pain is worse sometimes after prolonged periods of sitting. He has not taken any medications other than occasional Advil. No lower extremity weakness or numbness. No urine or stool incontinence.  No past medical history on file. No past surgical history on file.  reports that he has never smoked. He does not have any smokeless tobacco history on file. He reports that he does not use illicit drugs. His alcohol history is not on file. family history is not on file. No Known Allergies    Review of Systems  Constitutional: Negative for fever, chills, activity change and appetite change.  Respiratory: Negative for cough and shortness of breath.   Cardiovascular: Negative for chest pain and leg swelling.  Gastrointestinal: Negative for vomiting and abdominal pain.  Genitourinary: Negative for dysuria, hematuria and flank pain.  Musculoskeletal: Positive for back pain. Negative for joint swelling.  Neurological: Negative for weakness and numbness.       Objective:   Physical Exam  Constitutional: He is oriented to person, place, and time. He appears well-developed and well-nourished. No distress.  Neck: No thyromegaly present.  Cardiovascular: Normal rate, regular rhythm and normal heart sounds.   No murmur heard. Pulmonary/Chest: Effort normal and breath sounds normal. No respiratory distress. He has no wheezes. He has no rales.  Musculoskeletal: He exhibits no edema.  Neurological: He is alert and oriented to person, place, and time. He has normal reflexes. No cranial nerve deficit.  Full-strength lower extremities  Skin: No  rash noted.          Assessment & Plan:  Several month history of mid lower lumbar back pain. Nonfocal exam neurologically. Given duration of symptoms will check lumbar films. Consider physical therapy if unremarkable  Left L5 pars defect.  Pt notified.  Will set up sports medicine referral.

## 2014-07-13 NOTE — Progress Notes (Signed)
Pre visit review using our clinic review tool, if applicable. No additional management support is needed unless otherwise documented below in the visit note. 

## 2014-07-15 ENCOUNTER — Telehealth: Payer: Self-pay | Admitting: Family Medicine

## 2014-07-15 NOTE — Telephone Encounter (Signed)
Douglas Joyce spoke with the pt.

## 2014-07-15 NOTE — Telephone Encounter (Signed)
Pt would like a call back about his visit on yesterday 07/14/14.

## 2014-07-17 DIAGNOSIS — M4306 Spondylolysis, lumbar region: Secondary | ICD-10-CM | POA: Insufficient documentation

## 2014-07-17 NOTE — Addendum Note (Signed)
Addended by: [name] on: 07/17/2014 11:32 AM   Modules accepted: Orders

## 2014-08-22 ENCOUNTER — Telehealth: Payer: Self-pay | Admitting: Family Medicine

## 2014-08-22 NOTE — Telephone Encounter (Signed)
[facility] and is asking if Dr Caryl NeverBurchette has received the results

## 2014-08-22 NOTE — Telephone Encounter (Signed)
Not yet

## 2014-08-24 NOTE — Telephone Encounter (Signed)
Left message on [facility] that we have not received MRI results just yet.

## 2014-09-17 ENCOUNTER — Emergency Department (HOSPITAL_COMMUNITY)
Admission: EM | Admit: 2014-09-17 | Discharge: 2014-09-18 | Disposition: A | Discharge status: Home / Self Care | Payer: PRIVATE HEALTH INSURANCE | Attending: Emergency Medicine | Admitting: Emergency Medicine

## 2014-09-17 ENCOUNTER — Encounter (HOSPITAL_COMMUNITY): Payer: Self-pay | Admitting: Emergency Medicine

## 2014-09-17 ENCOUNTER — Emergency Department (HOSPITAL_COMMUNITY): Payer: PRIVATE HEALTH INSURANCE

## 2014-09-17 DIAGNOSIS — Y9289 Other specified places as the place of occurrence of the external cause: Secondary | ICD-10-CM | POA: Diagnosis not present

## 2014-09-17 DIAGNOSIS — Y9389 Activity, other specified: Secondary | ICD-10-CM | POA: Diagnosis not present

## 2014-09-17 DIAGNOSIS — S6991XA Unspecified injury of right wrist, hand and finger(s), initial encounter: Secondary | ICD-10-CM | POA: Insufficient documentation

## 2014-09-17 DIAGNOSIS — S93401A Sprain of unspecified ligament of right ankle, initial encounter: Secondary | ICD-10-CM | POA: Insufficient documentation

## 2014-09-17 DIAGNOSIS — R55 Syncope and collapse: Secondary | ICD-10-CM | POA: Insufficient documentation

## 2014-09-17 DIAGNOSIS — Y999 Unspecified external cause status: Secondary | ICD-10-CM | POA: Insufficient documentation

## 2014-09-17 DIAGNOSIS — T07XXXA Unspecified multiple injuries, initial encounter: Secondary | ICD-10-CM

## 2014-09-17 DIAGNOSIS — T1490 Injury, unspecified: Secondary | ICD-10-CM | POA: Insufficient documentation

## 2014-09-17 DIAGNOSIS — S99911A Unspecified injury of right ankle, initial encounter: Secondary | ICD-10-CM | POA: Diagnosis present

## 2014-09-17 LAB — BASIC METABOLIC PANEL
Anion gap: 12 (ref 5–15)
BUN: 16 mg/dL (ref 6–20)
CO2: 23 mmol/L (ref 22–32)
CREATININE: 1.49 mg/dL — AB (ref 0.61–1.24)
Calcium: 9.6 mg/dL (ref 8.9–10.3)
Chloride: 104 mmol/L (ref 101–111)
GFR calc non Af Amer: >60 mL/min (ref >60)
GLUCOSE: 104 mg/dL — AB (ref 65–99)
Potassium: 3.6 mmol/L (ref 3.5–5.1)
Sodium: 139 mmol/L (ref 135–145)

## 2014-09-17 LAB — CBC WITH DIFFERENTIAL/PLATELET
Basophils Absolute: 0 10*3/uL (ref 0.0–0.1)
Basophils Relative: 0 % (ref 0–1)
EOS ABS: 0.1 10*3/uL (ref 0.0–0.7)
Eosinophils Relative: 1 % (ref 0–5)
HEMATOCRIT: 38.4 % — AB (ref 39.0–52.0)
HEMOGLOBIN: 13.5 g/dL (ref 13.0–17.0)
LYMPHS ABS: 1 10*3/uL (ref 0.7–4.0)
Lymphocytes Relative: 9 % — ABNORMAL LOW (ref 12–46)
MCH: 32 pg (ref 26.0–34.0)
MCHC: 35.2 g/dL (ref 30.0–36.0)
MCV: 91 fL (ref 78.0–100.0)
Monocytes Absolute: 0.9 10*3/uL (ref 0.1–1.0)
Monocytes Relative: 8 % (ref 3–12)
NEUTROS ABS: 9.2 10*3/uL — AB (ref 1.7–7.7)
Neutrophils Relative %: 82 % — ABNORMAL HIGH (ref 43–77)
PLATELETS: 313 10*3/uL (ref 150–400)
RBC: 4.22 MIL/uL (ref 4.22–5.81)
RDW: 12.3 % (ref 11.5–15.5)
WBC: 11.2 10*3/uL — ABNORMAL HIGH (ref 4.0–10.5)

## 2014-09-17 LAB — ETHANOL

## 2014-09-17 MED ORDER — FENTANYL CITRATE (PF) 100 MCG/2ML IJ SOLN
50.0000 ug | INTRAMUSCULAR | Status: DC | PRN
  Administered 2014-09-17 (×2): 50 ug via INTRAVENOUS
  Filled 2014-09-17: qty 2

## 2014-09-17 MED ORDER — SODIUM CHLORIDE 0.9 % IV BOLUS (SEPSIS)
500.0000 mL | Freq: Once | INTRAVENOUS | Status: AC
  Administered 2014-09-17: 500 mL via INTRAVENOUS

## 2014-09-17 MED ORDER — ONDANSETRON HCL 4 MG/2ML IJ SOLN
4.0000 mg | Freq: Once | INTRAMUSCULAR | Status: AC
  Administered 2014-09-17: 4 mg via INTRAVENOUS
  Filled 2014-09-17: qty 2

## 2014-09-17 MED ORDER — SODIUM CHLORIDE 0.9 % IV SOLN: Freq: Once | INTRAVENOUS | Status: DC
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
## 2014-09-17 NOTE — ED Notes (Signed)
Patient transported to X-ray 

## 2014-09-18 MED ORDER — HYDROCODONE-ACETAMINOPHEN 5-325 MG PO TABS: 1.0000 | ORAL_TABLET | ORAL | Status: DC | PRN

## 2014-09-18 MED ORDER — IBUPROFEN 800 MG PO TABS: 800.0000 mg | ORAL_TABLET | Freq: Three times a day (TID) | ORAL | Status: DC

## 2014-09-18 MED ORDER — METHOCARBAMOL 500 MG PO TABS: 500.0000 mg | ORAL_TABLET | Freq: Three times a day (TID) | ORAL | Status: DC | PRN

## 2014-09-18 NOTE — ED Notes (Signed)
Ortho tech paged  

## 2014-09-18 NOTE — Discharge Instructions (Signed)
Walk with crutches, and leave splint in place until your pain is improving. When you can walk without a limp, you may removed the splint. Increase your weightbearing as tolerated using your crutches until pain-free.  Ankle Sprain An ankle sprain is an injury to the strong, fibrous tissues (ligaments) that hold your ankle bones together.  HOME CARE   Put ice on your ankle for 1-2 days or as told by your doctor.  Put ice in a plastic bag.  Place a towel between your skin and the bag.  Leave the ice on for 15-20 minutes at a time, every 2 hours while you are awake.  Only take medicine as told by your doctor.  Raise (elevate) your injured ankle above the level of your heart as much as possible for 2-3 days.  Use crutches if your doctor tells you to. Slowly put your own weight on the affected ankle. Use the crutches until you can walk without pain.  If you have a plaster splint:  Do not rest it on anything harder than a pillow for 24 hours.  Do not put weight on it.  Do not get it wet.  Take it off to shower or bathe.  If given, use an elastic wrap or support stocking for support. Take the wrap off if your toes lose feeling (numb), tingle, or turn cold or blue.  If you have an air splint:  Add or let out air to make it comfortable.  Take it off at night and to shower and bathe.  Wiggle your toes and move your ankle up and down often while you are wearing it. GET HELP IF:  You have rapidly increasing bruising or puffiness (swelling).  Your toes feel very cold.  You lose feeling in your foot.  Your medicine does not help your pain. GET HELP RIGHT AWAY IF:   Your toes lose feeling (numb) or turn blue.  You have severe pain that is increasing. MAKE SURE YOU:   Understand these instructions.  Will watch your condition.  Will get help right away if you are not doing well or get worse. Document Released: 07/10/2007 Document Revised: 06/07/2013 Document Reviewed:  08/05/2011 [facility] Patient Information 2015 [place], [place]. This information is not intended to replace advice given to you by your health care provider. Make sure you discuss any questions you have with your health care provider.
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
## 2014-09-22 DIAGNOSIS — S92101A Unspecified fracture of right talus, initial encounter for closed fracture: Secondary | ICD-10-CM | POA: Insufficient documentation

## 2014-09-22 DIAGNOSIS — M25571 Pain in right ankle and joints of right foot: Secondary | ICD-10-CM | POA: Insufficient documentation

## 2014-09-22 DIAGNOSIS — S93401A Sprain of unspecified ligament of right ankle, initial encounter: Secondary | ICD-10-CM | POA: Insufficient documentation

## 2014-12-28 ENCOUNTER — Ambulatory Visit (INDEPENDENT_AMBULATORY_CARE_PROVIDER_SITE_OTHER): Payer: PRIVATE HEALTH INSURANCE | Admitting: Family Medicine

## 2014-12-28 ENCOUNTER — Encounter: Payer: Self-pay | Admitting: Family Medicine

## 2014-12-28 VITALS — BP 118/70 | HR 72 | Temp 98.2°F | Resp 14 | Ht 69.0 in | Wt 187.3 lb

## 2014-12-28 DIAGNOSIS — Z23 Encounter for immunization: Secondary | ICD-10-CM

## 2014-12-28 DIAGNOSIS — Z113 Encounter for screening for infections with a predominantly sexual mode of transmission: Secondary | ICD-10-CM
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
## 2014-12-29 LAB — HIV ANTIBODY (ROUTINE TESTING W REFLEX): HIV 1&2 Ab, 4th Generation: NONREACTIVE

## 2014-12-30 LAB — RPR

## 2015-01-02 LAB — GC/CHLAMYDIA PROBE AMP, URINE
Chlamydia, Swab/Urine, PCR: NEGATIVE
GC Probe Amp, Urine: NEGATIVE

## 2016-01-12 DIAGNOSIS — L689 Hypertrichosis, unspecified: Secondary | ICD-10-CM | POA: Insufficient documentation

## 2016-07-03 ENCOUNTER — Ambulatory Visit: Payer: BC Managed Care – PPO | Admitting: Family Medicine

## 2016-11-15 ENCOUNTER — Encounter: Payer: BC Managed Care – PPO | Admitting: Family Medicine

## 2016-11-25 ENCOUNTER — Encounter: Payer: BC Managed Care – PPO | Admitting: Family Medicine

## 2017-09-11 ENCOUNTER — Telehealth: Payer: Self-pay | Admitting: Family Medicine

## 2017-09-11 NOTE — Telephone Encounter (Signed)
Copied from CRM [phone]. Topic: General - Other >> Sep 11, 2017 10:53 AM Baldo DaubAlexander, Amber L wrote: Reason for CRM:  Pt's father calling. Pt will start college on Saturday and he needs to take a copy of his immunizations with him.  Leighton ParodyBryce can be reached at [phone]

## 2017-09-11 NOTE — Telephone Encounter (Signed)
Dad calling back to check on status of getting immunizations.

## 2017-09-12 NOTE — Telephone Encounter (Signed)
I printed a copy of vaccine record in [facility], left a voice message for pt, this is ready for pick up here at front office.

## 2017-09-16 ENCOUNTER — Encounter: Payer: Self-pay | Admitting: Family Medicine

## 2017-09-29 ENCOUNTER — Encounter: Payer: Self-pay | Admitting: Family Medicine

## 2017-09-29 ENCOUNTER — Other Ambulatory Visit (HOSPITAL_COMMUNITY)
Admission: RE | Admit: 2017-09-29 | Discharge: 2017-09-29 | Disposition: A | Discharge status: Home / Self Care | Payer: Self-pay | Source: Ambulatory Visit | Attending: Family Medicine | Admitting: Family Medicine

## 2017-09-29 ENCOUNTER — Ambulatory Visit (INDEPENDENT_AMBULATORY_CARE_PROVIDER_SITE_OTHER): Payer: Self-pay | Admitting: Family Medicine

## 2017-09-29 VITALS — BP 110/80 | HR 74 | Temp 97.8°F | Ht 70.5 in | Wt 203.4 lb

## 2017-09-29 DIAGNOSIS — Z113 Encounter for screening for infections with a predominantly sexual mode of transmission: Secondary | ICD-10-CM | POA: Insufficient documentation

## 2017-09-29 DIAGNOSIS — Z0000 Encounter for general adult medical examination without abnormal findings: Secondary | ICD-10-CM

## 2017-09-29 DIAGNOSIS — Z23 Encounter for immunization: Secondary | ICD-10-CM

## 2017-09-29 LAB — BASIC METABOLIC PANEL
BUN: 9 mg/dL (ref 6–23)
CALCIUM: 9.8 mg/dL (ref 8.4–10.5)
CO2: 30 meq/L (ref 19–32)
Chloride: 100 mEq/L (ref 96–112)
Creatinine, Ser: 1.03 mg/dL (ref 0.40–1.50)
GFR: 111.53 mL/min (ref >60.00)
Glucose, Bld: 96 mg/dL (ref 70–99)
Potassium: 4.1 mEq/L (ref 3.5–5.1)
SODIUM: 135 meq/L (ref 135–145)

## 2017-09-29 LAB — LIPID PANEL
CHOL/HDL RATIO: 5
Cholesterol: 191 mg/dL (ref 0–200)
HDL: 40.6 mg/dL (ref >39.00)
LDL Cholesterol: 111 mg/dL — ABNORMAL HIGH (ref 0–99)
NonHDL: 150.54
TRIGLYCERIDES: 199 mg/dL — AB (ref 0.0–149.0)
VLDL: 39.8 mg/dL (ref 0.0–40.0)

## 2017-09-29 LAB — CBC WITH DIFFERENTIAL/PLATELET
BASOS ABS: 0 10*3/uL (ref 0.0–0.1)
Basophils Relative: 0.7 % (ref 0.0–3.0)
EOS PCT: 4.6 % (ref 0.0–5.0)
Eosinophils Absolute: 0.2 10*3/uL (ref 0.0–0.7)
HCT: 44 % (ref 39.0–52.0)
Hemoglobin: 15 g/dL (ref 13.0–17.0)
LYMPHS ABS: 2 10*3/uL (ref 0.7–4.0)
Lymphocytes Relative: 38.6 % (ref 12.0–46.0)
MCHC: 34.1 g/dL (ref 30.0–36.0)
MCV: 94.4 fl (ref 78.0–100.0)
Monocytes Absolute: 0.5 10*3/uL (ref 0.1–1.0)
Monocytes Relative: 9.3 % (ref 3.0–12.0)
NEUTROS ABS: 2.4 10*3/uL (ref 1.4–7.7)
NEUTROS PCT: 46.8 % (ref 43.0–77.0)
PLATELETS: 350 10*3/uL (ref 150.0–400.0)
RBC: 4.66 Mil/uL (ref 4.22–5.81)
RDW: 13.3 % (ref 11.5–15.5)
WBC: 5.1 10*3/uL (ref 4.0–10.5)

## 2017-09-29 LAB — HEPATIC FUNCTION PANEL
ALBUMIN: 4.6 g/dL (ref 3.5–5.2)
ALT: 61 U/L — ABNORMAL HIGH (ref 0–53)
AST: 28 U/L (ref 0–37)
Alkaline Phosphatase: 63 U/L (ref 39–117)
Bilirubin, Direct: 0 mg/dL (ref 0.0–0.3)
TOTAL PROTEIN: 7.5 g/dL (ref 6.0–8.3)
Total Bilirubin: 0.3 mg/dL (ref 0.2–1.2)

## 2017-09-29 LAB — TSH: TSH: 1.87 u[IU]/mL (ref 0.35–4.50)

## 2017-09-29 NOTE — Progress Notes (Signed)
  Subjective:     Patient ID: Douglas Joyce, male   DOB: 11-21-1990, 27 y.o.   MRN: [id number]  HPI Patient seen for physical exam. Generally healthy 27 year old male. He is going back to school trying to finish up degree in business. Possible eventual plans to enter [employer]. Takes no medications. Nonsmoker.  Tetanus up-to-date. Patient needs flu vaccine. He request STD testing. No specific symptoms.  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 He does have occasional intermittent back pain and even occasional right radiculitis symptoms. No numbness or weakness.  No past medical history on file. No past surgical history on file.  reports that he has never smoked. He has never used smokeless tobacco. He reports that he does not drink alcohol or use drugs. family history is not on file. No Known Allergies   Review of Systems  Constitutional: Negative for activity change, appetite change, fatigue and fever.  HENT: Negative for congestion, ear pain and trouble swallowing.   Eyes: Negative for pain and visual disturbance.  Respiratory: Negative for cough, shortness of breath and wheezing.   Cardiovascular: Negative for chest pain and palpitations.  Gastrointestinal: Negative for abdominal distention, abdominal pain, blood in stool, constipation, diarrhea, nausea, rectal pain and vomiting.  Genitourinary: Negative for dysuria, hematuria and testicular pain.  Musculoskeletal: Positive for back pain. Negative for arthralgias and joint swelling.  Skin: Negative for rash.  Neurological: Negative for dizziness, syncope and headaches.  Hematological: Negative for adenopathy.  Psychiatric/Behavioral: Negative for confusion and dysphoric mood.       Objective:   Physical Exam  Constitutional: He is oriented to person, place, and time. He appears well-developed and well-nourished. No distress.  HENT:  Head: Normocephalic and atraumatic.  Right Ear: External ear normal.  Left  Ear: External ear normal.  Mouth/Throat: Oropharynx is clear and moist.  Eyes: Pupils are equal, round, and reactive to light. Conjunctivae and EOM are normal.  Neck: Normal range of motion. Neck supple. No thyromegaly present.  Cardiovascular: Normal rate, regular rhythm and normal heart sounds.  No murmur heard. Pulmonary/Chest: No respiratory distress. He has no wheezes. He has no rales.  Abdominal: Soft. Bowel sounds are normal. He exhibits no distension and no mass. There is no tenderness. There is no rebound and no guarding.  Musculoskeletal: He exhibits no edema.  Lymphadenopathy:    He has no cervical adenopathy.  Neurological: He is alert and oriented to person, place, and time. He displays normal reflexes. No cranial nerve deficit.  Symmetric reflexes lower extremities  Skin: No rash noted.  Psychiatric: He has a normal mood and affect.       Assessment:     Physical exam. Generally healthy 27 year old male. He's had some intermittent chronic back difficulties with history of L5 pars defect    Plan:     -obtain screening labs. Will include STD screening. -flu vaccine given -Establish more consistent exercise  Douglas CoveyBruce W Rami Waddle MD [facility]

## 2017-09-29 NOTE — Patient Instructions (Signed)
Spondylolysis Spondylolysis is a small break or crack (stress fracture) in a bone in the spine (vertebra) in the lower back (lumbar spine). The stress fracture occurs on the bony mass between and behind the vertebra. Spondylolysis may be caused by an injury (trauma) or by overuse. Since the lower back is almost always under pressure from daily living, this stress fracture usually does not heal normally. Spondylolysis may eventually cause one vertebra to slip forward and out of place (spondylolisthesis). What are the causes? This condition may be caused by:  Trauma, such as a fall.  Excessive wear and tear. This is often a result of doing sports or physical activities that involve repetitive overstretching (hyperextension) and rotation of the spine.  What increases the risk? You may have a greater risk for spondylolysis if you participate in:  Gymnastics.  Weight lifting.  Rowing.  Diving.  Wrestling.  Tennis.  Soccer.  What are the signs or symptoms? Symptoms of this condition may include:  Long-lasting (chronic) pain in the lower back.  Stiffness in the back or the legs.  Tightness in the hamstring muscles, which are in the backs of the thighs.  In some cases, there may be no symptoms of this condition. How is this diagnosed?  This condition may be diagnosed based on:  Your symptoms.  Your medical history.  A physical exam. ? Your health care provider may push on certain areas to determine the source of your pain. ? You may be asked to bend forward, backward, and side to side so your health care provider can assess the severity of your pain and your range of motion.  Imaging tests, such as: ? X-rays. ? CT scan. ? MRI.  How is this treated? Treatment for this condition may include:  Resting. This may involve avoiding or modifying activities that put strain on your back until your symptoms improve.  Medicines to help relieve pain.  NSAIDs to help reduce  swelling and discomfort.  Injections of medicine (cortisone) in your back. These injections can to help relieve pain and numbness.  A brace to stabilize and support your back.  Physical therapy. This may include working with an occupational therapist or physical therapist who can teach you how to reduce pressure on your back while you do everyday activities.  Surgery. This may be needed if you have: ? A severe injury. ? Pain that lasts for more than 6 months.  Follow these instructions at home: If you have a brace:  Wear it as told by your health care provider. Remove it only as told by your health care provider.  Do not let your brace get wet if it is not waterproof.  Keep the brace clean. Driving  Do not drive or operate heavy machinery until you know how your pain medicine affects you.  Ask your health care provider when it is safe to drive if you have a back brace. Activity  Rest and return to your normal activities as told by your health care provider. Ask your health care provider what activities are safe for you.  Avoid activities that take a lot of effort (are strenuous) for as long as told by your health care provider.  Do exercises as told by your health care provider. General instructions  Take over-the-counter and prescription medicines only as told by your health care provider.  If you have questions or concerns about safety while taking pain medicine, talk with your health care provider.  Do not use any tobacco products,   such as cigarettes, chewing tobacco, and e-cigarettes. Tobacco can delay bone healing. If you need help quitting, ask your health care provider.  Keep all follow-up visits as told by your health care provider. This is important. How is this prevented?  Warm up and stretch before being active.  Cool down and stretch after being active.  Give your body time to rest between periods of activity.  Make sure to use equipment that fits  you.  Be safe and responsible while being active to avoid falls.  Do at least 150 minutes of moderate-intensity exercise each week, such as brisk walking or water aerobics.  Maintain physical fitness, including: ? Strength. ? Flexibility. ? Cardiovascular fitness. ? Endurance. Contact a health care provider if:  You have pain that gets worse or does not get better. Get help right away if:  You have severe back pain.  You develop weakness or numbness in your legs.  You are unable to stand or walk. This information is not intended to replace advice given to you by your health care provider. Make sure you discuss any questions you have with your health care provider. Document Released: 01/21/2005 Document Revised: 09/28/2015 Document Reviewed: 11/01/2014 [employer] Interactive Patient Education  2018 [employer]  

## 2017-09-30 ENCOUNTER — Other Ambulatory Visit: Payer: Self-pay | Admitting: Family Medicine

## 2017-09-30 DIAGNOSIS — R7989 Other specified abnormal findings of blood chemistry: Secondary | ICD-10-CM

## 2017-09-30 DIAGNOSIS — R945 Abnormal results of liver function studies: Principal | ICD-10-CM

## 2017-09-30 LAB — URINE CYTOLOGY ANCILLARY ONLY
CHLAMYDIA, DNA PROBE: NEGATIVE
Neisseria Gonorrhea: NEGATIVE

## 2017-09-30 LAB — HIV ANTIBODY (ROUTINE TESTING W REFLEX): HIV 1&2 Ab, 4th Generation: NONREACTIVE

## 2017-09-30 LAB — RPR: RPR Ser Ql: NONREACTIVE

## 2017-10-09 ENCOUNTER — Other Ambulatory Visit: Payer: Self-pay | Admitting: Family Medicine

## 2017-10-09 DIAGNOSIS — R748 Abnormal levels of other serum enzymes: Secondary | ICD-10-CM

## 2018-11-19 ENCOUNTER — Telehealth (INDEPENDENT_AMBULATORY_CARE_PROVIDER_SITE_OTHER): Payer: HRSA Program | Admitting: Family Medicine

## 2018-11-19 ENCOUNTER — Other Ambulatory Visit: Payer: Self-pay

## 2018-11-19 DIAGNOSIS — R05 Cough: Secondary | ICD-10-CM

## 2018-11-19 DIAGNOSIS — R0981 Nasal congestion: Secondary | ICD-10-CM | POA: Diagnosis not present

## 2018-11-19 DIAGNOSIS — Z20822 Contact with and (suspected) exposure to covid-19: Secondary | ICD-10-CM

## 2018-11-19 DIAGNOSIS — Z20828 Contact with and (suspected) exposure to other viral communicable diseases: Secondary | ICD-10-CM | POA: Diagnosis not present

## 2018-11-19 DIAGNOSIS — R059 Cough, unspecified: Secondary | ICD-10-CM

## 2018-11-19 MED ORDER — BENZONATATE 100 MG PO CAPS: 100.0000 mg | ORAL_CAPSULE | Freq: Three times a day (TID) | ORAL | 0 refills | Status: DC | PRN

## 2018-11-19 MED ORDER — ALBUTEROL SULFATE HFA 108 (90 BASE) MCG/ACT IN AERS: 1.0000 | INHALATION_SPRAY | Freq: Four times a day (QID) | RESPIRATORY_TRACT | 0 refills | Status: DC | PRN

## 2018-11-19 NOTE — Progress Notes (Signed)
Virtual Visit via Video Note  I connected with Douglas Joyce  on 11/19/18 at  3:00 PM EDT by a video enabled telemedicine application and verified that I am speaking with the correct person using two identifiers.  Location patient: home Location provider:work or home office Persons participating in the virtual visit: patient, provider  I discussed the limitations of evaluation and management by telemedicine and the availability of in person appointments. The patient expressed understanding and agreed to proceed.   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  No past medical history on file.  No past surgical history on file.  No family history on file.  SOCIAL HX: see hpi   Current Outpatient Medications:  .  albuterol (VENTOLIN HFA) 108 (90 Base) MCG/ACT inhaler, Inhale 1-2 puffs into the lungs every 6 (six) hours as needed., Disp: 8.5 g, Rfl: 0 .  benzonatate (TESSALON PERLES) 100 MG capsule, Take 1 capsule (100 mg total) by mouth 3 (three) times daily as needed., Disp: 20 capsule, Rfl: 0  EXAM:  VITALS per patient if applicable:  GENERAL: alert, oriented, appears well and in no acute distress  HEENT: atraumatic, conjunttiva clear, no obvious abnormalities on inspection of external nose and ears, occasional cough  NECK: normal movements of the head and neck  LUNGS: on inspection no signs of respiratory distress, breathing rate appears normal, no obvious gross SOB, gasping or  wheezing  CV: no obvious cyanosis  MS: moves all visible extremities without noticeable abnormality  PSYCH/NEURO: pleasant and cooperative, no obvious depression or anxiety, speech and thought processing grossly intact  ASSESSMENT AND PLAN:  Discussed the following assessment and plan:  Cough  Suspected COVID-19 virus infection (or other viral respiratory infection)  Nasal congestion  -we discussed possible serious and likely etiologies, options for evaluation and workup, limitations of telemedicine visit vs in person visit, treatment, treatment risks and precautions. Pt prefers to treat via telemedicine empirically rather then risking or undertaking an in person visit at this moment. Suspect VURI with possible bronchitis, very mild flu or COVID19 vs other also possible. Opted for symptomatic care, tessalon for cough, albuterol 1-2 puffs q 4-6 hours prn and home isolation per CDC guidelines. He also wants to do COVID19 testing. Advised of limitations and risk of false negative test. Orders placed for [facility] drive thru testing. Discussed possible complications and advised to seek prompt medical care if worsening, new symptoms arise, or if is not improving with treatment.   I discussed the assessment and treatment plan with the patient. The patient was provided an opportunity to ask questions and all were answered. The patient agreed with the plan and demonstrated an understanding of the instructions.   The patient was advised to call back or seek an in-person evaluation if the symptoms worsen or if the condition fails to improve as anticipated.   Douglas Koyanagi, DO   Patient Instructions  Follow up: as needed if symptoms worsen or persist or you have other concerns  Self Isolation/Home Quarantine: -see the CDC  site for information:   [email or web address].co.za.html   -STAY HOME except for to seek medical care -stay in your own room  away from others in your house and use a separate bathroom if possible -Wash hands frequently, disinfect high touch surface areas often, wear a mask if you leave your room and interact as little as possible with others -seek medical care immediately if worsening - call our office for a visit or call ahead if going elsewhere to an urgent care  -seek emergency care if very sick or severe symptoms - call 911 -isolate for at least 10 days from the onset of symptoms PLUS 3 days of no fever PLUS 3 days of improving symptoms  We recommend wearing a mask, asking others  around you to do the same and social distancing at all times when around others outside of your home unit throughout the [place] pandemic.   Novel Coronavirus Testing: I sent an order for coronavirus testing. No Appointment is needed.  Testing Sites:    [facility] Location:                            [address], [place] (old [facility]) Hours:                                 8a-3:45p, M-F  [facility] Location:                           [address], [place], [place] [address]                                                              [place] ([place]) Hours:                                 8a-3:45p, M-F  [facility] Location:                            [address], [address] (across from [place]) Hours:                                 8a-3:45p, M-F  Positive test. These tests are not 100% perfect, but if you tested positive for COVID-19, this confirms that you have contracted the SARS-CoV-2 virus. STAY HOME to complete full Quarantine per CDC guidelines.  Negative test. These tests are not 100% perfect but if you tested negative for COVID-19, this indicates that you may not have contracted the SARS-CoV-2 virus. Follow your doctor's recommendations and the CDC guidelines.

## 2018-11-19 NOTE — Patient Instructions (Addendum)
Follow up: as needed if symptoms worsen or persist or you have other concerns  Self Isolation/Home Quarantine: -see the CDC site for information:   RunningShows.co.za.html   -STAY HOME except for to seek medical care -stay in your own room away from others in your house and use a separate bathroom if possible -Wash hands frequently, disinfect high touch surface areas often, wear a mask if you leave your room and interact as little as possible with others -seek medical care immediately if worsening - call our office for a visit or call ahead if going elsewhere to an urgent care  -seek emergency care if very sick or severe symptoms - call 911 -isolate for at least 10 days from the onset of symptoms PLUS 3 days of no fever PLUS 3 days of improving symptoms  We recommend wearing a mask, social distancing, good hand hygiene and asking others  around you to do the same at all times when around others outside of your home unit throughout the [place] pandemic.    Novel Coronavirus Testing: I sent an order for coronavirus testing. No Appointment is needed.  Testing Sites:    [facility] Location:                            [address] [place] (old [facility]) Hours:                                 8a-3:45p, M-F  [facility] Location:                           [address] [place], [place] [address]                                                              [place] ([place]) Hours:                                 8a-3:45p, M-F  [facility] Location:                            [address], [address] (across from [place]) Hours:                                 8a-3:45p, M-F  Positive test. These tests are not 100% perfect, but if you tested positive for COVID-19, this confirms that you have contracted the SARS-CoV-2  virus. STAY HOME to complete full Quarantine per CDC guidelines.  Negative test. These tests are not 100% perfect but if you tested negative for COVID-19, this indicates that you may not have contracted the SARS-CoV-2 virus. Follow your doctor's recommendations and the CDC guidelines.

## 2018-11-21 ENCOUNTER — Other Ambulatory Visit: Payer: Self-pay

## 2018-11-21 DIAGNOSIS — Z20822 Contact with and (suspected) exposure to covid-19: Secondary | ICD-10-CM

## 2018-11-22 LAB — NOVEL CORONAVIRUS, NAA: SARS-CoV-2, NAA: NOT DETECTED

## 2018-11-23 ENCOUNTER — Telehealth: Payer: Self-pay | Admitting: General Practice

## 2018-11-23 NOTE — Telephone Encounter (Signed)
Negative COVID results given. Patient results "NOT Detected." Caller expressed understanding. ° °

## 2019-03-29 ENCOUNTER — Ambulatory Visit: Payer: Self-pay

## 2019-10-12 ENCOUNTER — Ambulatory Visit: Payer: Medicaid Other | Admitting: Family Medicine

## 2019-10-12 ENCOUNTER — Encounter: Payer: Self-pay | Admitting: Family Medicine

## 2019-10-12 ENCOUNTER — Other Ambulatory Visit: Payer: Self-pay

## 2019-10-12 VITALS — BP 98/64 | HR 85 | Temp 98.1°F | Ht 70.5 in | Wt 212.0 lb

## 2019-10-12 DIAGNOSIS — M545 Low back pain, unspecified: Secondary | ICD-10-CM

## 2019-10-12 NOTE — Patient Instructions (Signed)
Let me know if you find a physical therapy group closer to home and we can set up.

## 2019-10-12 NOTE — Progress Notes (Signed)
Established Patient Office Visit  Subjective:  Patient ID: Douglas Joyce, male    DOB: 1990-10-08  Age: 29 y.o. MRN: [id number]  CC:  Chief Complaint  Patient presents with  . Back Pain    HPI Douglas Joyce presents for intermittent low back pain for the past several months.  He states that over 3 years ago he was doing some heavy squats and flexed his back and felt a popping sensation.  He had acute pain at that time.  He went for x-rays and apparently these were reported as negative.  He has had some intermittent pain since then but particularly past few months.  Denies any recent injury.  He describes the pain lower lumbar area that radiates somewhat bilateral.  He has intermittent pain which radiates into the right thigh and occasional numbness involving the anterior and posterior thigh.  No weakness.  No urine or stool incontinence.  Denies any numbness of the foot or lower leg region.  No recent fever.  No appetite or weight changes.  Currently pain-free.  With episodes of flareup his pain usually only last a matter of hours.  History reviewed. No pertinent past medical history.  History reviewed. No pertinent surgical history.  History reviewed. No pertinent family history.  Social History   Socioeconomic History  . Marital status: Single    Spouse name: Not on file  . Number of children: Not on file  . Years of education: Not on file  . Highest education level: Not on file  Occupational History  . Not on file  Tobacco Use  . Smoking status: Never Smoker  . Smokeless tobacco: Never Used  Substance and Sexual Activity  . Alcohol use: No  . Drug use: No  . Sexual activity: Not on file  Other Topics Concern  . Not on file  Social History Narrative  . Not on file   Social Determinants of Health   Financial Resource Strain:   . Difficulty of Paying Living Expenses: Not on file  Food Insecurity:   . Worried About Programme researcher, broadcasting/film/video in the Last Year: Not on  file  . Ran Out of Food in the Last Year: Not on file  Transportation Needs:   . Lack of Transportation (Medical): Not on file  . Lack of Transportation (Non-Medical): Not on file  Physical Activity:   . Days of Exercise per Week: Not on file  . Minutes of Exercise per Session: Not on file  Stress:   . Feeling of Stress : Not on file  Social Connections:   . Frequency of Communication with Friends and Family: Not on file  . Frequency of Social Gatherings with Friends and Family: Not on file  . Attends Religious Services: Not on file  . Active Member of Clubs or Organizations: Not on file  . Attends Banker Meetings: Not on file  . Marital Status: Not on file  Intimate Partner Violence:   . Fear of Current or Ex-Partner: Not on file  . Emotionally Abused: Not on file  . Physically Abused: Not on file  . Sexually Abused: Not on file    Outpatient Medications Prior to Visit  Medication Sig Dispense Refill  . albuterol (VENTOLIN HFA) 108 (90 Base) MCG/ACT inhaler Inhale 1-2 puffs into the lungs every 6 (six) hours as needed. 8.5 g 0  . benzonatate (TESSALON PERLES) 100 MG capsule Take 1 capsule (100 mg total) by mouth 3 (three) times daily as needed. 20 capsule  0   No facility-administered medications prior to visit.    No Known Allergies  ROS Review of Systems  Constitutional: Negative for activity change, appetite change, fever and unexpected weight change.  Respiratory: Negative for cough and shortness of breath.   Cardiovascular: Negative for chest pain and leg swelling.  Gastrointestinal: Negative for abdominal pain and vomiting.  Genitourinary: Negative for dysuria, flank pain and hematuria.  Musculoskeletal: Positive for back pain. Negative for joint swelling.  Neurological: Negative for weakness and numbness.      Objective:    Physical Exam Constitutional:      General: He is not in acute distress.    Appearance: He is well-developed.  Neck:      Thyroid: No thyromegaly.  Cardiovascular:     Rate and Rhythm: Normal rate and regular rhythm.     Heart sounds: Normal heart sounds. No murmur heard.   Pulmonary:     Effort: Pulmonary effort is normal. No respiratory distress.     Breath sounds: Normal breath sounds. No wheezing or rales.  Skin:    Findings: No rash.  Neurological:     Mental Status: He is alert and oriented to person, place, and time.     Cranial Nerves: No cranial nerve deficit.     Deep Tendon Reflexes: Reflexes are normal and symmetric.     Comments: Full strength lower extremities.  He has only trace reflexes knee bilaterally and 1+ ankle bilaterally.  Normal sensory function to touch     BP 98/64   Pulse 85   Temp 98.1 F (36.7 C) (Oral)   Ht 5' 10.5" (1.791 m)   Wt 212 lb (96.2 kg)   SpO2 97%   BMI 29.99 kg/m  Wt Readings from Last 3 Encounters:  10/12/19 212 lb (96.2 kg)  09/29/17 203 lb 6.4 oz (92.3 kg)  12/28/14 187 lb 4.8 oz (85 kg)     Health Maintenance Due  Topic Date Due  . Hepatitis C Screening  Never done  . COVID-19 Vaccine (1) Never done  . TETANUS/TDAP  09/02/2019  . INFLUENZA VACCINE  09/05/2019    There are no preventive care reminders to display for this patient.  Lab Results  Component Value Date   TSH 1.87 09/29/2017   Lab Results  Component Value Date   WBC 5.1 09/29/2017   HGB 15.0 09/29/2017   HCT 44.0 09/29/2017   MCV 94.4 09/29/2017   PLT 350.0 09/29/2017   Lab Results  Component Value Date   NA 135 09/29/2017   K 4.1 09/29/2017   CO2 30 09/29/2017   GLUCOSE 96 09/29/2017   BUN 9 09/29/2017   CREATININE 1.03 09/29/2017   BILITOT 0.3 09/29/2017   ALKPHOS 63 09/29/2017   AST 28 09/29/2017   ALT 61 (H) 09/29/2017   PROT 7.5 09/29/2017   ALBUMIN 4.6 09/29/2017   CALCIUM 9.8 09/29/2017   ANIONGAP 12 09/17/2014   GFR 111.53 09/29/2017   Lab Results  Component Value Date   CHOL 191 09/29/2017   Lab Results  Component Value Date   HDL 40.60  09/29/2017   Lab Results  Component Value Date   LDLCALC 111 (H) 09/29/2017   Lab Results  Component Value Date   TRIG 199.0 (H) 09/29/2017   Lab Results  Component Value Date   CHOLHDL 5 09/29/2017   No results found for: HGBA1C    Assessment & Plan:   Problem List Items Addressed This Visit    None  Visit Diagnoses    Bilateral low back pain without sciatica, unspecified chronicity    -  Primary   Relevant Orders   Ambulatory referral to Physical Therapy    Patient relates several month history of intermittent low back pain which is fairly diffuse with intermittent symptoms of paresthesia mostly right upper thigh region.  Nonfocal neuro exam at this time  -Given persistence of symptoms recommend as a first step starting some physical therapy to review some good extension stretches and back exercises -If symptoms progress or persist in spite of the above consider MRI to further assess.  It certainly sounds like he is having some nerve impingement intermittently  No orders of the defined types were placed in this encounter.   Follow-up: No follow-ups on file.    Evelena Peat, MD

## 2020-03-17 ENCOUNTER — Telehealth (INDEPENDENT_AMBULATORY_CARE_PROVIDER_SITE_OTHER): Payer: Medicaid Other | Admitting: Family Medicine

## 2020-03-17 DIAGNOSIS — F419 Anxiety disorder, unspecified: Secondary | ICD-10-CM

## 2020-03-17 MED ORDER — HYDROXYZINE PAMOATE 25 MG PO CAPS: 25.0000 mg | ORAL_CAPSULE | Freq: Four times a day (QID) | ORAL | 5 refills | Status: AC | PRN
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
## 2020-04-18 ENCOUNTER — Encounter: Payer: Self-pay | Admitting: Family Medicine

## 2020-04-18 ENCOUNTER — Telehealth (INDEPENDENT_AMBULATORY_CARE_PROVIDER_SITE_OTHER): Payer: Medicaid Other | Admitting: Family Medicine

## 2020-04-18 VITALS — Ht 70.5 in

## 2020-04-18 DIAGNOSIS — F4310 Post-traumatic stress disorder, unspecified: Secondary | ICD-10-CM | POA: Diagnosis not present

## 2020-04-18 DIAGNOSIS — F419 Anxiety disorder, unspecified: Secondary | ICD-10-CM | POA: Diagnosis not present

## 2020-04-18 MED ORDER — ESCITALOPRAM OXALATE 10 MG PO TABS: 10.0000 mg | ORAL_TABLET | Freq: Every day | ORAL | 5 refills | Status: AC
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
## 2020-07-17 ENCOUNTER — Ambulatory Visit: Payer: Medicaid Other | Admitting: Family Medicine

## 2022-10-21 ENCOUNTER — Telehealth: Payer: Self-pay

## 2022-10-21 NOTE — Transitions of Care (Post Inpatient/ED Visit) (Unsigned)
10/21/2022  Name: Douglas Joyce MRN: [id number] DOB: 03-04-1990  Today's TOC FU Call Status: Today's TOC FU Call Status:: Unsuccessful Call (1st Attempt) Unsuccessful Call (1st Attempt) Date: 10/21/22  Attempted to reach the patient regarding the most recent Inpatient/ED visit.  Follow Up Plan: Additional outreach attempts will be made to reach the patient to complete the Transitions of Care (Post Inpatient/ED visit) call.   Signature Karena Addison, LPN [facility] Nurse Health Advisor Direct Dial [phone]

## 2022-10-22 NOTE — Transitions of Care (Post Inpatient/ED Visit) (Unsigned)
10/22/2022  Name: Douglas Joyce MRN: [id number] DOB: 10/20/1990  Today's TOC FU Call Status: Today's TOC FU Call Status:: Unsuccessful Call (2nd Attempt) Unsuccessful Call (1st Attempt) Date: 10/21/22 Unsuccessful Call (2nd Attempt) Date: 10/22/22  Attempted to reach the patient regarding the most recent Inpatient/ED visit.  Follow Up Plan: Additional outreach attempts will be made to reach the patient to complete the Transitions of Care (Post Inpatient/ED visit) call.   Signature Karena Addison, LPN [facility] Nurse Health Advisor Direct Dial [phone]

## 2022-10-23 NOTE — Transitions of Care (Post Inpatient/ED Visit) (Signed)
10/23/2022  Name: Douglas Joyce MRN: [id number] DOB: [birth year]  Today's TOC FU Call Status: Today's TOC FU Call Status:: Unsuccessful Call (3rd Attempt) Unsuccessful Call (1st Attempt) Date: 10/21/22 Unsuccessful Call (2nd Attempt) Date: 10/22/22 Unsuccessful Call (3rd Attempt) Date: 10/23/22  Attempted to reach the patient regarding the most recent Inpatient/ED visit.  Follow Up Plan: No further outreach attempts will be made at this time. We have been unable to contact the patient.  Signature Karena Addison, LPN [facility] Nurse Health Advisor Direct Dial [phone]

## 2023-03-13 ENCOUNTER — Telehealth: Payer: Self-pay | Admitting: Family Medicine
